# Patient Record
Sex: Female | Born: 1960 | Race: White | Hispanic: No | Marital: Married | Smoking: Never smoker
Health system: Southern US, Community
[De-identification: ages and names within clinical notes are randomized; demographics above are authoritative.]

## PROBLEM LIST (undated history)

## (undated) DIAGNOSIS — M1990 Unspecified osteoarthritis, unspecified site: Secondary | ICD-10-CM

## (undated) DIAGNOSIS — G2581 Restless legs syndrome: Secondary | ICD-10-CM

## (undated) DIAGNOSIS — Z9889 Other specified postprocedural states: Secondary | ICD-10-CM

## (undated) DIAGNOSIS — O039 Complete or unspecified spontaneous abortion without complication: Secondary | ICD-10-CM

## (undated) DIAGNOSIS — T4145XA Adverse effect of unspecified anesthetic, initial encounter: Secondary | ICD-10-CM

## (undated) DIAGNOSIS — R112 Nausea with vomiting, unspecified: Secondary | ICD-10-CM

## (undated) DIAGNOSIS — T8859XA Other complications of anesthesia, initial encounter: Secondary | ICD-10-CM

## (undated) HISTORY — PX: ADENOIDECTOMY: SUR15

## (undated) HISTORY — PX: TONSILLECTOMY: SUR1361

## (undated) HISTORY — PX: DILATION AND CURETTAGE OF UTERUS: SHX78

## (undated) HISTORY — DX: Complete or unspecified spontaneous abortion without complication: O03.9

## (undated) HISTORY — PX: OVARIAN CYST SURGERY: SHX726

## (undated) HISTORY — PX: KNEE SURGERY: SHX244

---

## 2002-03-12 ENCOUNTER — Ambulatory Visit (HOSPITAL_COMMUNITY): Admission: AD | Admit: 2002-03-12 | Discharge: 2002-03-12 | Payer: Self-pay | Admitting: Obstetrics and Gynecology

## 2002-05-20 ENCOUNTER — Inpatient Hospital Stay (HOSPITAL_COMMUNITY): Admission: AD | Admit: 2002-05-20 | Discharge: 2002-05-20 | Payer: Self-pay | Admitting: Obstetrics & Gynecology

## 2002-05-25 ENCOUNTER — Inpatient Hospital Stay (HOSPITAL_COMMUNITY): Admission: AD | Admit: 2002-05-25 | Discharge: 2002-05-25 | Payer: Self-pay | Admitting: Obstetrics and Gynecology

## 2002-05-30 ENCOUNTER — Inpatient Hospital Stay (HOSPITAL_COMMUNITY): Admission: AD | Admit: 2002-05-30 | Discharge: 2002-06-01 | Payer: Self-pay | Admitting: Obstetrics and Gynecology

## 2003-07-28 ENCOUNTER — Other Ambulatory Visit: Admission: RE | Admit: 2003-07-28 | Discharge: 2003-07-28 | Payer: Self-pay | Admitting: Obstetrics and Gynecology

## 2004-09-07 ENCOUNTER — Other Ambulatory Visit: Admission: RE | Admit: 2004-09-07 | Discharge: 2004-09-07 | Payer: Self-pay | Admitting: Obstetrics and Gynecology

## 2005-10-07 ENCOUNTER — Other Ambulatory Visit: Admission: RE | Admit: 2005-10-07 | Discharge: 2005-10-07 | Payer: Self-pay | Admitting: Obstetrics and Gynecology

## 2011-01-20 ENCOUNTER — Ambulatory Visit
Admission: RE | Admit: 2011-01-20 | Discharge: 2011-01-20 | Disposition: A | Discharge status: Home / Self Care | Payer: BC Managed Care – PPO | Source: Ambulatory Visit | Attending: Obstetrics and Gynecology | Admitting: Obstetrics and Gynecology

## 2011-01-20 ENCOUNTER — Other Ambulatory Visit: Payer: Self-pay | Admitting: Obstetrics and Gynecology

## 2011-01-20 DIAGNOSIS — R928 Other abnormal and inconclusive findings on diagnostic imaging of breast: Secondary | ICD-10-CM

## 2011-01-26 ENCOUNTER — Other Ambulatory Visit: Payer: Self-pay

## 2014-06-19 ENCOUNTER — Other Ambulatory Visit: Payer: Self-pay | Admitting: Family Medicine

## 2014-06-19 ENCOUNTER — Other Ambulatory Visit (HOSPITAL_COMMUNITY)
Admission: RE | Admit: 2014-06-19 | Discharge: 2014-06-19 | Disposition: A | Discharge status: Home / Self Care | Payer: 59 | Source: Ambulatory Visit | Attending: Family Medicine | Admitting: Family Medicine

## 2014-06-19 DIAGNOSIS — Z01419 Encounter for gynecological examination (general) (routine) without abnormal findings: Secondary | ICD-10-CM | POA: Insufficient documentation

## 2014-06-20 LAB — CYTOLOGY - PAP

## 2014-06-30 ENCOUNTER — Other Ambulatory Visit: Payer: Self-pay

## 2014-06-30 DIAGNOSIS — Z1231 Encounter for screening mammogram for malignant neoplasm of breast: Secondary | ICD-10-CM

## 2014-07-07 ENCOUNTER — Ambulatory Visit
Admission: RE | Admit: 2014-07-07 | Discharge: 2014-07-07 | Disposition: A | Discharge status: Home / Self Care | Payer: 59 | Source: Ambulatory Visit

## 2014-07-07 DIAGNOSIS — Z1231 Encounter for screening mammogram for malignant neoplasm of breast: Secondary | ICD-10-CM

## 2015-03-05 ENCOUNTER — Other Ambulatory Visit: Payer: Self-pay | Admitting: Family Medicine

## 2015-03-05 ENCOUNTER — Ambulatory Visit
Admission: RE | Admit: 2015-03-05 | Discharge: 2015-03-05 | Disposition: A | Discharge status: Home / Self Care | Payer: BLUE CROSS/BLUE SHIELD | Source: Ambulatory Visit | Attending: Family Medicine | Admitting: Family Medicine

## 2015-03-05 DIAGNOSIS — M25449 Effusion, unspecified hand: Secondary | ICD-10-CM

## 2015-07-14 ENCOUNTER — Other Ambulatory Visit: Payer: Self-pay

## 2015-07-14 DIAGNOSIS — Z1231 Encounter for screening mammogram for malignant neoplasm of breast: Secondary | ICD-10-CM

## 2015-07-14 DIAGNOSIS — Z803 Family history of malignant neoplasm of breast: Secondary | ICD-10-CM

## 2015-08-10 ENCOUNTER — Ambulatory Visit
Admission: RE | Admit: 2015-08-10 | Discharge: 2015-08-10 | Disposition: A | Discharge status: Home / Self Care | Payer: BLUE CROSS/BLUE SHIELD | Source: Ambulatory Visit

## 2015-08-10 DIAGNOSIS — Z803 Family history of malignant neoplasm of breast: Secondary | ICD-10-CM

## 2015-08-10 DIAGNOSIS — Z1231 Encounter for screening mammogram for malignant neoplasm of breast: Secondary | ICD-10-CM

## 2016-07-25 ENCOUNTER — Other Ambulatory Visit: Payer: Self-pay | Admitting: Family Medicine

## 2016-07-25 DIAGNOSIS — Z1231 Encounter for screening mammogram for malignant neoplasm of breast: Secondary | ICD-10-CM

## 2016-08-12 ENCOUNTER — Ambulatory Visit
Admission: RE | Admit: 2016-08-12 | Discharge: 2016-08-12 | Disposition: A | Discharge status: Home / Self Care | Payer: BLUE CROSS/BLUE SHIELD | Source: Ambulatory Visit | Attending: Family Medicine | Admitting: Family Medicine

## 2016-08-12 DIAGNOSIS — Z1231 Encounter for screening mammogram for malignant neoplasm of breast: Secondary | ICD-10-CM

## 2016-08-16 ENCOUNTER — Other Ambulatory Visit (HOSPITAL_COMMUNITY)
Admission: RE | Admit: 2016-08-16 | Discharge: 2016-08-16 | Disposition: A | Discharge status: Home / Self Care | Payer: BLUE CROSS/BLUE SHIELD | Source: Ambulatory Visit | Attending: Family Medicine | Admitting: Family Medicine

## 2016-08-16 ENCOUNTER — Other Ambulatory Visit: Payer: Self-pay | Admitting: Family Medicine

## 2016-08-16 DIAGNOSIS — Z01419 Encounter for gynecological examination (general) (routine) without abnormal findings: Secondary | ICD-10-CM | POA: Diagnosis present

## 2016-08-17 LAB — CYTOLOGY - PAP: Diagnosis: NEGATIVE

## 2016-10-07 ENCOUNTER — Ambulatory Visit (INDEPENDENT_AMBULATORY_CARE_PROVIDER_SITE_OTHER): Payer: BLUE CROSS/BLUE SHIELD | Admitting: Allergy & Immunology

## 2016-10-07 ENCOUNTER — Encounter: Payer: Self-pay | Admitting: Allergy & Immunology

## 2016-10-07 VITALS — BP 118/72 | HR 74 | Temp 98.2°F | Resp 14 | Ht 67.0 in | Wt 167.0 lb

## 2016-10-07 DIAGNOSIS — M25569 Pain in unspecified knee: Secondary | ICD-10-CM

## 2016-10-07 DIAGNOSIS — G8929 Other chronic pain: Secondary | ICD-10-CM | POA: Insufficient documentation

## 2016-10-07 DIAGNOSIS — T781XXD Other adverse food reactions, not elsewhere classified, subsequent encounter: Secondary | ICD-10-CM

## 2016-10-07 DIAGNOSIS — T781XXA Other adverse food reactions, not elsewhere classified, initial encounter: Secondary | ICD-10-CM | POA: Insufficient documentation

## 2016-10-07 MED ORDER — PROMETHAZINE HCL 25 MG PO TABS: 25.0000 mg | ORAL_TABLET | Freq: Four times a day (QID) | ORAL | 1 refills | Status: DC | PRN

## 2016-10-07 NOTE — Progress Notes (Signed)
NEW PATIENT  Date of Service/Encounter:  10/07/16   Assessment:   Adverse food reaction (seafood)    Plan/Recommendations:   1. Adverse food reaction - Testing today was negative, but the positive control was also negative making interpretation impossible. - We will get blood testing to look at evidence of allergies. - We will not send in an EpiPen at this time. - This might just be an intolerance, but I think it is a good idea to make sure that this is not an allergy.  2. Return if symptoms worsen or fail to improve.    Subjective:   LAKEDRA WASHINGTON is a 56 y.o. female presenting today for evaluation of  Chief Complaint  Patient presents with  . New Evaluation  . Allergy Testing    just wants to discuss shellfish allergy. had severe reaction September 2017. started with difficulty breathing, bloating, cramping, severe diarrhea, vomiting. takes a couple of benadryl and that helps.  most recent reaction included bloody stool.     KAILYNNE FERRINGTON has a history of the following: Patient Active Problem List   Diagnosis Date Noted  . Chronic knee pain 10/07/2016  . Adverse food reaction 10/07/2016    History obtained from: chart review and patient.  Melany Guernsey was referred by Cala Bradford, MD.     Sedra is a 56 y.o. female presenting for concerns for a seafood allergy. She was diagnosed when she was in her teenage years. At the time, she would go to beach with her family and have a big fish dinner. Symptoms have only been isolated to the GI system. Her symptoms started within 1 hour of ingestion. She will push through her symptoms, but if she starts to vomit she will decide to take Benadryl. She also has Phenergan on hand to take as needed. The GI symptoms will continue for the next 24-36 hours before resolving. She has no other systemic symptoms including wheezing, swelling, throat closure, or any other concerns.   She has avoided all shellfish since that time,  including mollusks. She eats fin fish without a problem, including salmon. Her last reaction occurred in November when she was away at a conference. At the conference, everyone was eating a dinner of salmon and lobster. She did make the kitchen staff aware of her allergies, and supposedly they were instructed to cut the medial without the risk of cross contamination. She had salmon on her plate and no evidence of lobster. However, despite this, she did develop a reaction that took around 36 hours to fully improve. At home, she manages this by strictly avoiding cross contamination with utensils. She is able to tolerate shellfish being cooked in the home however.  She is otherwise able to tolerate all of the other 7 major food allergens. She does have a history of seasonal allergic rhinitis symptoms which she treats with Flonase and Claritin as needed. She is not interested in doing testing for environmental allergens since her symptoms are not that severe. She has no history of hives. She has no history of wheezing.  Otherwise, there is no history of other atopic diseases, including asthma, drug allergies, food allergies, environmental allergies, stinging insect allergies, or urticaria. There is no significant infectious history. Vaccinations are up to date.    Past Medical History: Patient Active Problem List   Diagnosis Date Noted  . Chronic knee pain 10/07/2016  . Adverse food reaction 10/07/2016    Medication List:  Allergies as of 10/07/2016  Reactions   Shellfish Allergy Shortness Of Breath, Diarrhea, Nausea And Vomiting   Sulfa Antibiotics Rash, Other (See Comments)   Depletes potassium      Medication List       Accurate as of 10/07/16  1:20 PM. Always use your most recent med list.          Calcium 500-100 MG-UNIT Chew Chew 1,000 tablets by mouth daily.   cholecalciferol 1000 units tablet Commonly known as:  VITAMIN D Take 1,000 Units by mouth daily.   GARLIC PO Take 1  tablet by mouth daily.   promethazine 25 MG tablet Commonly known as:  PHENERGAN Take 1 tablet (25 mg total) by mouth every 6 (six) hours as needed for nausea or vomiting.   TURMERIC PO Take 1 capsule by mouth daily.       Birth History: non-contributory.   Developmental History: non-contributory.  Past Surgical History: Past Surgical History:  Procedure Laterality Date  . ADENOIDECTOMY    . KNEE SURGERY     5x  . OVARIAN CYST SURGERY    . TONSILLECTOMY       Family History: Family History  Problem Relation Age of Onset  . Allergic rhinitis Father      Social History: Alvino Chapelllen lives at home with her husband and two children (ages 7014 and 21 years). 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 This causes her to travel around the entire country on a fairly regular basis. They live in a house that was built in 1984. There is carpeting throughout the home. They have gas heating and central cooling. There are no pets within the home and no smoke exposure. They do not use dust mite covers. Prior to working at her current job, she worked in with [employer]    Review of Systems: a 14-point review of systems is pertinent for what is mentioned in HPI.  Otherwise, all other systems were negative. Constitutional: negative other than that listed in the HPI Eyes: negative other than that listed in the HPI Ears, nose, mouth, throat, and face: negative other than that listed in the HPI Respiratory: negative other than that listed in the HPI Cardiovascular: negative other than that listed in the HPI Gastrointestinal: negative other than that listed in the HPI Genitourinary: negative other than that listed in the HPI Integument: negative other than that listed in the HPI Hematologic: negative other than that listed in the HPI Musculoskeletal: negative  other than that listed in the HPI Neurological: negative other than that listed in the HPI Allergy/Immunologic: negative other than that listed in the HPI    Objective:   Blood pressure 118/72, pulse 74, temperature 98.2 F (36.8 C), temperature source Oral, resp. rate 14, height 5\' 7"  (1.702 m), weight 167 lb (75.8 kg), SpO2 97 %. Body mass index is 26.16 kg/m.   Physical Exam:  General: Alert, interactive, in no acute distress. Cooperative with the exam. Very friendly and personable.  Eyes: No conjunctival injection present on the right, No conjunctival injection present on the left, PERRL bilaterally, No discharge on the right, No discharge on the left and No Horner-Trantas dots present Ears: Right TM pearly gray with normal light reflex, Left TM pearly gray with normal light reflex, Right TM intact without perforation and Left TM intact without perforation.  Nose/Throat: External nose within normal limits and septum  midline, turbinates edematous without discharge, post-pharynx mildly erythematous without cobblestoning in the posterior oropharynx. Tonsils 2+ without exudates Neck: Supple without thyromegaly. Adenopathy: no enlarged lymph nodes appreciated in the anterior cervical, occipital, axillary, epitrochlear, inguinal, or popliteal regions Lungs: Clear to auscultation without wheezing, rhonchi or rales. No increased work of breathing. CV: Normal S1/S2, no murmurs. Capillary refill <2 seconds.  Abdomen: Nondistended, nontender. No guarding or rebound tenderness. Bowel sounds present in all fields and hypoactive  Skin: Warm and dry, without lesions or rashes. Extremities:  No clubbing, cyanosis or edema. Neuro:   Grossly intact. No focal deficits appreciated. Responsive to questions.  Diagnostic studies:   Allergy Studies:   Selected Foods Panel: Negative to shellfish mix, fish mix, flounder, trout, shrimp, crab, tuna, oyster, lobster, scallops, and salmon. However, the  positive control was negative making the test uninterpretable.    Malachi Bonds, MD FAAAAI [facility]

## 2016-10-07 NOTE — Patient Instructions (Addendum)
1. Adverse food reaction - Testing today was negative, but the positive control was also negative making interpretation impossible. - We will get blood testing to look at evidence of allergies. - We will not send in an EpiPen at this time. - This might just be an intolerance, but I think it is a good idea to make sure that this is not an allergy.  2. Return if symptoms worsen or fail to improve.  Please inform Janice Mercer of any Emergency Department visits, hospitalizations, or changes in symptoms. Call Janice Mercer before going to the ED for breathing or allergy symptoms since we might be able to fit you in for a sick visit. Feel free to contact Janice Mercer anytime with any questions, problems, or concerns.  It was a pleasure to meet you today! Best wishes in the [place] Year!   Websites that have reliable patient information: 1. American Academy of Asthma, Allergy, and Immunology: www.aaaai.org 2. Food Allergy Research and Education (FARE): foodallergy.org 3. Mothers of Asthmatics: http://www.asthmacommunitynetwork.org 4. American College of Allergy, Asthma, and Immunology: www.acaai.org

## 2016-10-10 LAB — CP658 FISH PANEL
Allergen, Salmon, f41: <0.1 kU/L
Allergen, Trout, f204: <0.1 kU/L
Tuna IgE: <0.1 kU/L

## 2016-10-10 LAB — ALLERGY-SHELLFISH PANEL
Clams: <0.1 kU/L
Lobster: <0.1 kU/L

## 2016-11-04 ENCOUNTER — Telehealth: Payer: BLUE CROSS/BLUE SHIELD | Admitting: Nurse Practitioner

## 2016-11-04 DIAGNOSIS — J0100 Acute maxillary sinusitis, unspecified: Secondary | ICD-10-CM

## 2016-11-04 MED ORDER — AMOXICILLIN-POT CLAVULANATE 875-125 MG PO TABS: 1.0000 | ORAL_TABLET | Freq: Two times a day (BID) | ORAL | 0 refills | Status: DC

## 2016-11-04 NOTE — Progress Notes (Signed)

## 2017-07-27 ENCOUNTER — Other Ambulatory Visit: Payer: Self-pay | Admitting: Family Medicine

## 2017-07-27 DIAGNOSIS — Z1231 Encounter for screening mammogram for malignant neoplasm of breast: Secondary | ICD-10-CM

## 2017-08-18 ENCOUNTER — Ambulatory Visit: Payer: BLUE CROSS/BLUE SHIELD

## 2017-10-16 ENCOUNTER — Ambulatory Visit
Admission: RE | Admit: 2017-10-16 | Discharge: 2017-10-16 | Disposition: A | Discharge status: Home / Self Care | Payer: Self-pay | Source: Ambulatory Visit | Attending: Family Medicine | Admitting: Family Medicine

## 2017-10-16 DIAGNOSIS — Z1231 Encounter for screening mammogram for malignant neoplasm of breast: Secondary | ICD-10-CM

## 2018-07-23 ENCOUNTER — Encounter (HOSPITAL_COMMUNITY): Payer: Self-pay

## 2018-07-23 NOTE — Patient Instructions (Addendum)
Janice Mercer  07/23/2018   Your procedure is scheduled on: 08-13-18  Report to [facility] Main  Entrance   Report to admitting at     1130 AM    Call this number if you have problems the morning of surgery [phone]    Remember: Do not eat food  :After Midnight.yo may have clear liquids until 0800 am then nothing by mouth     CLEAR LIQUID DIET   Foods Allowed                                                                     Foods Excluded  Coffee and tea, regular and decaf                             liquids that you cannot  Plain Jell-O in any flavor                                             see through such as: Fruit ices (not with fruit pulp)                                     milk, soups, orange juice  Iced Popsicles                                    All solid food Carbonated beverages, regular and diet                                    Cranberry, grape and apple juices Sports drinks like Gatorade Lightly seasoned clear broth or consume(fat free) Sugar, honey syrup   _____________________________________________________________________    BRUSH YOUR TEETH MORNING OF SURGERY AND RINSE YOUR MOUTH OUT, NO CHEWING GUM CANDY OR MINTS.     Take these medicines the morning of surgery with A SIP OF WATER: NONE                                You may not have any metal on your body including hair pins and              piercings  Do not wear jewelry, make-up, lotions, powders or perfumes, deodorant             Do not wear nail polish.  Do not shave  48 hours prior to surgery.        Do not bring valuables to the hospital. [facility] IS NOT             RESPONSIBLE   FOR VALUABLES.  Contacts, dentures or bridgework may not be worn into surgery.  Leave suitcase in the car. After surgery it may be brought to  your room.                  Please read over the following fact sheets you were  given: _____________________________________________________________________           [facility] - Preparing for Surgery Before surgery, you can play an important role.  Because skin is not sterile, your skin needs to be as free of germs as possible.  You can reduce the number of germs on your skin by washing with CHG (chlorahexidine gluconate) soap before surgery.  CHG is an antiseptic cleaner which kills germs and bonds with the skin to continue killing germs even after washing. Please DO NOT use if you have an allergy to CHG or antibacterial soaps.  If your skin becomes reddened/irritated stop using the CHG and inform your nurse when you arrive at Short Stay. Do not shave (including legs and underarms) for at least 48 hours prior to the first CHG shower.  You may shave your face/neck. Please follow these instructions carefully:  1.  Shower with CHG Soap the night before surgery and the  morning of Surgery.  2.  If you choose to wash your hair, wash your hair first as usual with your  normal  shampoo.  3.  After you shampoo, rinse your hair and body thoroughly to remove the  shampoo.                           4.  Use CHG as you would any other liquid soap.  You can apply chg directly  to the skin and wash                       Gently with a scrungie or clean washcloth.  5.  Apply the CHG Soap to your body ONLY FROM THE NECK DOWN.   Do not use on face/ open                           Wound or open sores. Avoid contact with eyes, ears mouth and genitals (private parts).                       Wash face,  Genitals (private parts) with your normal soap.             6.  Wash thoroughly, paying special attention to the area where your surgery  will be performed.  7.  Thoroughly rinse your body with warm water from the neck down.  8.  DO NOT shower/wash with your normal soap after using and rinsing off  the CHG Soap.                9.  Pat yourself dry with a clean towel.            10.  Wear clean  pajamas.            11.  Place clean sheets on your bed the night of your first shower and do not  sleep with pets. Day of Surgery : Do not apply any lotions/deodorants the morning of surgery.  Please wear clean clothes to the hospital/surgery center.  FAILURE TO FOLLOW THESE INSTRUCTIONS MAY RESULT IN THE CANCELLATION OF YOUR SURGERY PATIENT SIGNATURE_________________________________  NURSE SIGNATURE__________________________________  ________________________________________________________________________  WHAT IS A BLOOD TRANSFUSION? Blood Transfusion Information  A transfusion is the replacement of blood  or some of its parts. Blood is made up of multiple cells which provide different functions.  Red blood cells carry oxygen and are used for blood loss replacement.  White blood cells fight against infection.  Platelets control bleeding.  Plasma helps clot blood.  Other blood products are available for specialized needs, such as hemophilia or other clotting disorders. BEFORE THE TRANSFUSION  Who gives blood for transfusions?   Healthy volunteers who are fully evaluated to make sure their blood is safe. This is blood bank blood. Transfusion therapy is the safest it has ever been in the practice of medicine. Before blood is taken from a donor, a complete history is taken to make sure that person has no history of diseases nor engages in risky social behavior (examples are intravenous drug use or sexual activity with multiple partners). The donor's travel history is screened to minimize risk of transmitting infections, such as malaria. The donated blood is tested for signs of infectious diseases, such as HIV and hepatitis. The blood is then tested to be sure it is compatible with you in order to minimize the chance of a transfusion reaction. If you or a relative donates blood, this is often done in anticipation of surgery and is not appropriate for emergency situations. It takes many days  to process the donated blood. RISKS AND COMPLICATIONS Although transfusion therapy is very safe and saves many lives, the main dangers of transfusion include:   Getting an infectious disease.  Developing a transfusion reaction. This is an allergic reaction to something in the blood you were given. Every precaution is taken to prevent this. The decision to have a blood transfusion has been considered carefully by your caregiver before blood is given. Blood is not given unless the benefits outweigh the risks. AFTER THE TRANSFUSION  Right after receiving a blood transfusion, you will usually feel much better and more energetic. This is especially true if your red blood cells have gotten low (anemic). The transfusion raises the level of the red blood cells which carry oxygen, and this usually causes an energy increase.  The nurse administering the transfusion will monitor you carefully for complications. HOME CARE INSTRUCTIONS  No special instructions are needed after a transfusion. You may find your energy is better. Speak with your caregiver about any limitations on activity for underlying diseases you may have. SEEK MEDICAL CARE IF:   Your condition is not improving after your transfusion.  You develop redness or irritation at the intravenous (IV) site. SEEK IMMEDIATE MEDICAL CARE IF:  Any of the following symptoms occur over the next 12 hours:  Shaking chills.  You have a temperature by mouth above 102 F (38.9 C), not controlled by medicine.  Chest, back, or muscle pain.  People around you feel you are not acting correctly or are confused.  Shortness of breath or difficulty breathing.  Dizziness and fainting.  You get a rash or develop hives.  You have a decrease in urine output.  Your urine turns a dark color or changes to pink, red, or brown. Any of the following symptoms occur over the next 10 days:  You have a temperature by mouth above 102 F (38.9 C), not controlled  by medicine.  Shortness of breath.  Weakness after normal activity.  The white part of the eye turns yellow (jaundice).  You have a decrease in the amount of urine or are urinating less often.  Your urine turns a dark color or changes to pink, red, or  brown. Document Released: 09/16/2000 Document Revised: 12/12/2011 Document Reviewed: 05/05/2008 [facility] Patient Information 2014 [place], [place].  _______________________________________________________________________

## 2018-07-30 ENCOUNTER — Encounter (HOSPITAL_COMMUNITY)
Admission: RE | Admit: 2018-07-30 | Discharge: 2018-07-30 | Disposition: A | Discharge status: Home / Self Care | Payer: Managed Care, Other (non HMO) | Source: Ambulatory Visit | Attending: Orthopedic Surgery | Admitting: Orthopedic Surgery

## 2018-07-30 ENCOUNTER — Other Ambulatory Visit: Payer: Self-pay

## 2018-07-30 ENCOUNTER — Encounter (HOSPITAL_COMMUNITY): Payer: Self-pay

## 2018-07-30 DIAGNOSIS — M1712 Unilateral primary osteoarthritis, left knee: Secondary | ICD-10-CM | POA: Diagnosis not present

## 2018-07-30 DIAGNOSIS — Z01812 Encounter for preprocedural laboratory examination: Secondary | ICD-10-CM | POA: Insufficient documentation

## 2018-07-30 HISTORY — DX: Other complications of anesthesia, initial encounter: T88.59XA

## 2018-07-30 HISTORY — DX: Other specified postprocedural states: Z98.890

## 2018-07-30 HISTORY — DX: Unspecified osteoarthritis, unspecified site: M19.90

## 2018-07-30 HISTORY — DX: Nausea with vomiting, unspecified: R11.2

## 2018-07-30 HISTORY — DX: Adverse effect of unspecified anesthetic, initial encounter: T41.45XA

## 2018-07-30 LAB — SURGICAL PCR SCREEN
MRSA, PCR: NEGATIVE
Staphylococcus aureus: NEGATIVE

## 2018-07-30 NOTE — H&P (Signed)
TOTAL KNEE ADMISSION H&P  Patient is being admitted for left total knee arthroplasty.  Subjective:  Chief Complaint:  Left knee primary OA / pain  HPI: Janice Mercer, 57 y.o. female, has a history of pain and functional disability in the left knee due to arthritis and has failed non-surgical conservative treatments for greater than 12 weeks to include NSAID's and/or analgesics, corticosteriod injections, viscosupplementation injections and activity modification.  Onset of symptoms was gradual, starting >10 years ago with gradually worsening course since that time. The patient noted prior procedures on the knee to include  arthroscopy, menisectomy and micro fracture and scar debridement on the left knee(s).  Patient currently rates pain in the left knee(s) at 7 out of 10 with activity. Patient has night pain, worsening of pain with activity and weight bearing, pain that interferes with activities of daily living, pain with passive range of motion, crepitus and joint swelling.  Patient has evidence of periarticular osteophytes and joint space narrowing by imaging studies.  There is no active infection.  Risks, benefits and expectations were discussed with the patient.  Risks including but not limited to the risk of anesthesia, blood clots, nerve damage, blood vessel damage, failure of the prosthesis, infection and up to and including death.  Patient understand the risks, benefits and expectations and wishes to proceed with surgery.   PCP: Laurann Montana, MD  D/C Plans:       Home   Post-op Meds:       No Rx given  Tranexamic Acid:      To be given - IV   Decadron:      Is to be given  FYI:      ASA  Norco  DME:   Pt already has equipment   PT:   OPPT Rx given   Patient Active Problem List   Diagnosis Date Noted  . Chronic knee pain 10/07/2016  . Adverse food reaction 10/07/2016   Past Medical History:  Diagnosis Date  . Miscarriage     Past Surgical History:  Procedure Laterality  Date  . ADENOIDECTOMY    . KNEE SURGERY     5x  . OVARIAN CYST SURGERY    . TONSILLECTOMY      No current facility-administered medications for this encounter.    Current Outpatient Medications  Medication Sig Dispense Refill Last Dose  . BLACK ELDERBERRY PO Take 5 mLs by mouth at bedtime.     . Cholecalciferol (VITAMIN D3 PO) Take 63 mcg by mouth daily.     . DUEXIS 800-26.6 MG TABS Take 1 tablet by mouth 2 (two) times daily as needed for pain.  3   . Magnesium 100 MG TABS Take 100 mg by mouth 3 (three) times a week. In the evening.     . Probiotic Product (PROBIOTIC PO) Take 1 capsule by mouth daily.      Allergies  Allergen Reactions  . Shellfish Allergy Shortness Of Breath, Diarrhea and Nausea And Vomiting  . Amoxicillin     Has patient had a PCN reaction causing immediate rash, facial/tongue/throat swelling, SOB or lightheadedness with hypotension: No Has patient had a PCN reaction causing severe rash involving mucus membranes or skin necrosis: No Has patient had a PCN reaction that required hospitalization: No Has patient had a PCN reaction occurring within the last 10 years: Unknown If all of the above answers are "NO", then may proceed with Cephalosporin use.   . Lasix [Furosemide] Other (See Comments)  Facial flushing  . Sulfa Antibiotics Rash and Other (See Comments)    Depletes potassium    Social History   Tobacco Use  . Smoking status: Never Smoker  . Smokeless tobacco: Never Used  Substance Use Topics  . Alcohol use: Yes    Alcohol/week: 7.0 standard drinks    Types: 7 Glasses of wine per week    Family History  Problem Relation Age of Onset  . Allergic rhinitis Father      Review of Systems  Constitutional: Negative.   HENT: Negative.   Eyes: Negative.   Respiratory: Negative.   Cardiovascular: Negative.   Gastrointestinal: Negative.   Genitourinary: Negative.   Musculoskeletal: Positive for joint pain.  Skin: Negative.   Neurological:  Negative.   Endo/Heme/Allergies: Negative.   Psychiatric/Behavioral: Negative.     Objective:  Physical Exam  Constitutional: She is oriented to person, place, and time. She appears well-developed.  HENT:  Head: Normocephalic.  Eyes: Pupils are equal, round, and reactive to light.  Neck: Neck supple. No JVD present. No tracheal deviation present. No thyromegaly present.  Cardiovascular: Normal rate, regular rhythm and intact distal pulses.  Respiratory: Effort normal and breath sounds normal. No respiratory distress. She has no wheezes.  GI: Soft. There is no tenderness. There is no guarding.  Musculoskeletal:       Left knee: She exhibits decreased range of motion, swelling and bony tenderness. She exhibits no ecchymosis, no deformity, no laceration and no erythema. Tenderness found.  Lymphadenopathy:    She has no cervical adenopathy.  Neurological: She is alert and oriented to person, place, and time. A sensory deficit (neuropathy of right foot) is present.  Skin: Skin is warm and dry.  Psychiatric: She has a normal mood and affect.      Labs:  Estimated body mass index is 26.16 kg/m as calculated from the following:   Height as of 10/07/16: 5\' 7"  (1.702 m).   Weight as of 10/07/16: 75.8 kg.   Imaging Review Plain radiographs demonstrate severe degenerative joint disease of the left knee(s).  The bone quality appears to be good for age and reported activity level.   Preoperative templating of the joint replacement has been completed, documented, and submitted to the Operating Room personnel in order to optimize intra-operative equipment management.    Patient's anticipated LOS is less than 2 midnights, meeting these requirements: - Younger than 15 - Lives within 1 hour of care - Has a competent adult at home to recover with post-op recover - NO history of  - Chronic pain requiring opiods  - Diabetes  - Coronary Artery Disease  - Heart failure  - Heart attack  -  Stroke  - DVT/VTE  - Cardiac arrhythmia  - Respiratory Failure/COPD  - Renal failure  - Anemia  - Advanced Liver disease     Assessment/Plan:  End stage arthritis, left knee   The patient history, physical examination, clinical judgment of the provider and imaging studies are consistent with end stage degenerative joint disease of the left knee(s) and total knee arthroplasty is deemed medically necessary. The treatment options including medical management, injection therapy arthroscopy and arthroplasty were discussed at length. The risks and benefits of total knee arthroplasty were presented and reviewed. The risks due to aseptic loosening, infection, stiffness, patella tracking problems, thromboembolic complications and other imponderables were discussed. The patient acknowledged the explanation, agreed to proceed with the plan and consent was signed. Patient is being admitted for inpatient treatment for surgery,  pain control, PT, OT, prophylactic antibiotics, VTE prophylaxis, progressive ambulation and ADL's and discharge planning. The patient is planning to be discharged home.      Anastasio Auerbach Aja Whitehair   PA-C  07/30/2018, 8:00 AM

## 2018-07-30 NOTE — Progress Notes (Signed)
Cbc/diff cmp, ua, and hgba1c from 07-19-18 on chart

## 2018-08-13 ENCOUNTER — Encounter (HOSPITAL_COMMUNITY): Payer: Self-pay | Admitting: General Practice

## 2018-08-13 ENCOUNTER — Other Ambulatory Visit: Payer: Self-pay

## 2018-08-13 ENCOUNTER — Ambulatory Visit (HOSPITAL_COMMUNITY): Payer: Managed Care, Other (non HMO) | Admitting: Certified Registered Nurse Anesthetist

## 2018-08-13 ENCOUNTER — Encounter (HOSPITAL_COMMUNITY)
Admission: RE | Disposition: A | Discharge status: Home / Self Care | Payer: Self-pay | Source: Ambulatory Visit | Attending: Orthopedic Surgery

## 2018-08-13 ENCOUNTER — Observation Stay (HOSPITAL_COMMUNITY)
Admission: RE | Admit: 2018-08-13 | Discharge: 2018-08-14 | Disposition: A | Discharge status: Home / Self Care | Payer: Managed Care, Other (non HMO) | Source: Ambulatory Visit | Attending: Orthopedic Surgery | Admitting: Orthopedic Surgery

## 2018-08-13 DIAGNOSIS — Z79899 Other long term (current) drug therapy: Secondary | ICD-10-CM | POA: Insufficient documentation

## 2018-08-13 DIAGNOSIS — Z91013 Allergy to seafood: Secondary | ICD-10-CM | POA: Insufficient documentation

## 2018-08-13 DIAGNOSIS — Z882 Allergy status to sulfonamides status: Secondary | ICD-10-CM | POA: Diagnosis not present

## 2018-08-13 DIAGNOSIS — Z880 Allergy status to penicillin: Secondary | ICD-10-CM | POA: Insufficient documentation

## 2018-08-13 DIAGNOSIS — Z888 Allergy status to other drugs, medicaments and biological substances status: Secondary | ICD-10-CM | POA: Insufficient documentation

## 2018-08-13 DIAGNOSIS — M1712 Unilateral primary osteoarthritis, left knee: Secondary | ICD-10-CM | POA: Diagnosis not present

## 2018-08-13 DIAGNOSIS — Z96652 Presence of left artificial knee joint: Secondary | ICD-10-CM

## 2018-08-13 DIAGNOSIS — Z96659 Presence of unspecified artificial knee joint: Secondary | ICD-10-CM

## 2018-08-13 HISTORY — PX: TOTAL KNEE ARTHROPLASTY: SHX125

## 2018-08-13 LAB — TYPE AND SCREEN
ABO/RH(D): A NEG
ANTIBODY SCREEN: NEGATIVE

## 2018-08-13 LAB — ABO/RH: ABO/RH(D): A NEG

## 2018-08-13 SURGERY — ARTHROPLASTY, KNEE, TOTAL
Anesthesia: Spinal | Site: Knee | Laterality: Left

## 2018-08-13 MED ORDER — LACTATED RINGERS IV SOLN
INTRAVENOUS | Status: DC
  Administered 2018-08-13 (×3): via INTRAVENOUS

## 2018-08-13 MED ORDER — HYDROCODONE-ACETAMINOPHEN 7.5-325 MG PO TABS: 1.0000 | ORAL_TABLET | ORAL | 0 refills | Status: DC | PRN

## 2018-08-13 MED ORDER — DEXAMETHASONE SODIUM PHOSPHATE 10 MG/ML IJ SOLN
10.0000 mg | Freq: Once | INTRAMUSCULAR | Status: AC
  Administered 2018-08-13: 10 mg via INTRAVENOUS

## 2018-08-13 MED ORDER — SODIUM CHLORIDE (PF) 0.9 % IJ SOLN
INTRAMUSCULAR | Status: AC
  Filled 2018-08-13: qty 50

## 2018-08-13 MED ORDER — BUPIVACAINE IN DEXTROSE 0.75-8.25 % IT SOLN
INTRATHECAL | Status: DC | PRN
  Administered 2018-08-13: 1.6 mL via INTRATHECAL

## 2018-08-13 MED ORDER — ASPIRIN 81 MG PO CHEW: 81.0000 mg | CHEWABLE_TABLET | Freq: Two times a day (BID) | ORAL | 0 refills | Status: AC

## 2018-08-13 MED ORDER — BUPIVACAINE-EPINEPHRINE (PF) 0.25% -1:200000 IJ SOLN
INTRAMUSCULAR | Status: AC
  Filled 2018-08-13: qty 30

## 2018-08-13 MED ORDER — DEXAMETHASONE SODIUM PHOSPHATE 10 MG/ML IJ SOLN
10.0000 mg | Freq: Once | INTRAMUSCULAR | Status: AC
  Administered 2018-08-14: 10 mg via INTRAVENOUS
  Filled 2018-08-13: qty 1

## 2018-08-13 MED ORDER — KETOROLAC TROMETHAMINE 30 MG/ML IJ SOLN
INTRAMUSCULAR | Status: AC
  Filled 2018-08-13: qty 1

## 2018-08-13 MED ORDER — FENTANYL CITRATE (PF) 100 MCG/2ML IJ SOLN
INTRAMUSCULAR | Status: AC
  Filled 2018-08-13: qty 2

## 2018-08-13 MED ORDER — ONDANSETRON HCL 4 MG/2ML IJ SOLN
INTRAMUSCULAR | Status: DC | PRN
  Administered 2018-08-13: 4 mg via INTRAVENOUS

## 2018-08-13 MED ORDER — FERROUS SULFATE 325 (65 FE) MG PO TABS
325.0000 mg | ORAL_TABLET | Freq: Two times a day (BID) | ORAL | Status: DC
  Administered 2018-08-14: 325 mg via ORAL
  Filled 2018-08-13: qty 1

## 2018-08-13 MED ORDER — FENTANYL CITRATE (PF) 100 MCG/2ML IJ SOLN
25.0000 ug | INTRAMUSCULAR | Status: DC | PRN
  Administered 2018-08-13 (×2): 50 ug via INTRAVENOUS

## 2018-08-13 MED ORDER — PROPOFOL 500 MG/50ML IV EMUL
INTRAVENOUS | Status: DC | PRN
  Administered 2018-08-13: 50 ug/kg/min via INTRAVENOUS

## 2018-08-13 MED ORDER — CEFAZOLIN SODIUM-DEXTROSE 2-4 GM/100ML-% IV SOLN
2.0000 g | Freq: Four times a day (QID) | INTRAVENOUS | Status: AC
  Administered 2018-08-13 – 2018-08-14 (×2): 2 g via INTRAVENOUS
  Filled 2018-08-13 (×2): qty 100

## 2018-08-13 MED ORDER — HYDROCODONE-ACETAMINOPHEN 5-325 MG PO TABS
1.0000 | ORAL_TABLET | ORAL | Status: DC | PRN
  Administered 2018-08-14 (×2): 2 via ORAL
  Filled 2018-08-13 (×2): qty 2

## 2018-08-13 MED ORDER — MIDAZOLAM HCL 2 MG/2ML IJ SOLN
1.0000 mg | INTRAMUSCULAR | Status: DC
  Administered 2018-08-13: 2 mg via INTRAVENOUS
  Filled 2018-08-13: qty 2

## 2018-08-13 MED ORDER — TRANEXAMIC ACID-NACL 1000-0.7 MG/100ML-% IV SOLN
1000.0000 mg | Freq: Once | INTRAVENOUS | Status: AC
  Administered 2018-08-13: 1000 mg via INTRAVENOUS
  Filled 2018-08-13: qty 100

## 2018-08-13 MED ORDER — ACETAMINOPHEN 325 MG PO TABS: 325.0000 mg | ORAL_TABLET | Freq: Four times a day (QID) | ORAL | Status: DC | PRN

## 2018-08-13 MED ORDER — CEFAZOLIN SODIUM-DEXTROSE 2-4 GM/100ML-% IV SOLN
2.0000 g | INTRAVENOUS | Status: AC
  Administered 2018-08-13: 2 g via INTRAVENOUS
  Filled 2018-08-13: qty 100

## 2018-08-13 MED ORDER — ONDANSETRON HCL 4 MG/2ML IJ SOLN: 4.0000 mg | Freq: Once | INTRAMUSCULAR | Status: DC | PRN

## 2018-08-13 MED ORDER — PROPOFOL 10 MG/ML IV BOLUS
INTRAVENOUS | Status: AC
  Filled 2018-08-13: qty 20

## 2018-08-13 MED ORDER — METOCLOPRAMIDE HCL 5 MG PO TABS: 5.0000 mg | ORAL_TABLET | Freq: Three times a day (TID) | ORAL | Status: DC | PRN

## 2018-08-13 MED ORDER — BISACODYL 10 MG RE SUPP: 10.0000 mg | Freq: Every day | RECTAL | Status: DC | PRN

## 2018-08-13 MED ORDER — ROPIVACAINE HCL 5 MG/ML IJ SOLN
INTRAMUSCULAR | Status: DC | PRN
  Administered 2018-08-13: 30 mL via PERINEURAL

## 2018-08-13 MED ORDER — STERILE WATER FOR IRRIGATION IR SOLN
Status: DC | PRN
  Administered 2018-08-13: 2000 mL

## 2018-08-13 MED ORDER — OXYCODONE HCL 5 MG PO TABS: 5.0000 mg | ORAL_TABLET | Freq: Once | ORAL | Status: DC | PRN

## 2018-08-13 MED ORDER — ALUM & MAG HYDROXIDE-SIMETH 200-200-20 MG/5ML PO SUSP: 15.0000 mL | ORAL | Status: DC | PRN

## 2018-08-13 MED ORDER — PHENOL 1.4 % MT LIQD: 1.0000 | OROMUCOSAL | Status: DC | PRN

## 2018-08-13 MED ORDER — MENTHOL 3 MG MT LOZG: 1.0000 | LOZENGE | OROMUCOSAL | Status: DC | PRN

## 2018-08-13 MED ORDER — KETAMINE HCL 10 MG/ML IJ SOLN
INTRAMUSCULAR | Status: DC | PRN
  Administered 2018-08-13: 25 mg via INTRAVENOUS

## 2018-08-13 MED ORDER — HYDROCODONE-ACETAMINOPHEN 7.5-325 MG PO TABS
1.0000 | ORAL_TABLET | ORAL | Status: DC | PRN
  Administered 2018-08-13 (×2): 1 via ORAL
  Filled 2018-08-13 (×2): qty 1

## 2018-08-13 MED ORDER — TRANEXAMIC ACID-NACL 1000-0.7 MG/100ML-% IV SOLN
1000.0000 mg | INTRAVENOUS | Status: AC
  Administered 2018-08-13: 1000 mg via INTRAVENOUS
  Filled 2018-08-13: qty 100

## 2018-08-13 MED ORDER — 0.9 % SODIUM CHLORIDE (POUR BTL) OPTIME
TOPICAL | Status: DC | PRN
  Administered 2018-08-13: 1000 mL

## 2018-08-13 MED ORDER — SODIUM CHLORIDE 0.9 % IV SOLN
INTRAVENOUS | Status: DC
  Administered 2018-08-13: 19:00:00 via INTRAVENOUS

## 2018-08-13 MED ORDER — KETAMINE HCL 10 MG/ML IJ SOLN
INTRAMUSCULAR | Status: AC
  Filled 2018-08-13: qty 1

## 2018-08-13 MED ORDER — DEXMEDETOMIDINE HCL 200 MCG/2ML IV SOLN
INTRAVENOUS | Status: DC | PRN
  Administered 2018-08-13: 12 ug via INTRAVENOUS
  Administered 2018-08-13 (×2): 8 ug via INTRAVENOUS

## 2018-08-13 MED ORDER — DOCUSATE SODIUM 100 MG PO CAPS
100.0000 mg | ORAL_CAPSULE | Freq: Two times a day (BID) | ORAL | Status: DC
  Administered 2018-08-13 – 2018-08-14 (×2): 100 mg via ORAL
  Filled 2018-08-13 (×2): qty 1

## 2018-08-13 MED ORDER — METOCLOPRAMIDE HCL 5 MG/ML IJ SOLN: 5.0000 mg | Freq: Three times a day (TID) | INTRAMUSCULAR | Status: DC | PRN

## 2018-08-13 MED ORDER — METHOCARBAMOL 500 MG IVPB - SIMPLE MED
500.0000 mg | Freq: Four times a day (QID) | INTRAVENOUS | Status: DC | PRN
  Filled 2018-08-13 (×2): qty 50

## 2018-08-13 MED ORDER — BUPIVACAINE-EPINEPHRINE (PF) 0.25% -1:200000 IJ SOLN
INTRAMUSCULAR | Status: DC | PRN
  Administered 2018-08-13: 30 mL via PERINEURAL

## 2018-08-13 MED ORDER — FENTANYL CITRATE (PF) 100 MCG/2ML IJ SOLN
50.0000 ug | INTRAMUSCULAR | Status: DC
  Administered 2018-08-13 (×2): 50 ug via INTRAVENOUS
  Filled 2018-08-13: qty 2

## 2018-08-13 MED ORDER — PROPOFOL 10 MG/ML IV BOLUS
INTRAVENOUS | Status: DC | PRN
  Administered 2018-08-13: 10 mg via INTRAVENOUS
  Administered 2018-08-13: 20 mg via INTRAVENOUS

## 2018-08-13 MED ORDER — DIPHENHYDRAMINE HCL 12.5 MG/5ML PO ELIX
12.5000 mg | ORAL_SOLUTION | ORAL | Status: DC | PRN
  Administered 2018-08-13: 25 mg via ORAL
  Filled 2018-08-13 (×2): qty 10

## 2018-08-13 MED ORDER — ONDANSETRON HCL 4 MG/2ML IJ SOLN
4.0000 mg | Freq: Four times a day (QID) | INTRAMUSCULAR | Status: DC | PRN
  Administered 2018-08-13: 4 mg via INTRAVENOUS
  Filled 2018-08-13: qty 2

## 2018-08-13 MED ORDER — MAGNESIUM CITRATE PO SOLN: 1.0000 | Freq: Once | ORAL | Status: DC | PRN

## 2018-08-13 MED ORDER — POLYETHYLENE GLYCOL 3350 17 G PO PACK: 17.0000 g | PACK | Freq: Two times a day (BID) | ORAL | 0 refills | Status: DC

## 2018-08-13 MED ORDER — ONDANSETRON HCL 4 MG PO TABS: 4.0000 mg | ORAL_TABLET | Freq: Four times a day (QID) | ORAL | Status: DC | PRN

## 2018-08-13 MED ORDER — KETOROLAC TROMETHAMINE 30 MG/ML IJ SOLN
INTRAMUSCULAR | Status: DC | PRN
  Administered 2018-08-13: 30 mg via INTRA_ARTICULAR

## 2018-08-13 MED ORDER — CHLORHEXIDINE GLUCONATE 4 % EX LIQD: 60.0000 mL | Freq: Once | CUTANEOUS | Status: DC

## 2018-08-13 MED ORDER — SODIUM CHLORIDE (PF) 0.9 % IJ SOLN
INTRAMUSCULAR | Status: DC | PRN
  Administered 2018-08-13: 30 mL

## 2018-08-13 MED ORDER — METHOCARBAMOL 500 MG PO TABS
500.0000 mg | ORAL_TABLET | Freq: Four times a day (QID) | ORAL | Status: DC | PRN
  Administered 2018-08-13: 500 mg via ORAL
  Filled 2018-08-13: qty 1

## 2018-08-13 MED ORDER — FERROUS SULFATE 325 (65 FE) MG PO TABS: 325.0000 mg | ORAL_TABLET | Freq: Three times a day (TID) | ORAL | 3 refills | Status: DC

## 2018-08-13 MED ORDER — POLYETHYLENE GLYCOL 3350 17 G PO PACK
17.0000 g | PACK | Freq: Two times a day (BID) | ORAL | Status: DC
  Filled 2018-08-13: qty 1

## 2018-08-13 MED ORDER — ASPIRIN 81 MG PO CHEW
81.0000 mg | CHEWABLE_TABLET | Freq: Two times a day (BID) | ORAL | Status: DC
  Administered 2018-08-13 – 2018-08-14 (×2): 81 mg via ORAL
  Filled 2018-08-13 (×2): qty 1

## 2018-08-13 MED ORDER — OXYCODONE HCL 5 MG/5ML PO SOLN
5.0000 mg | Freq: Once | ORAL | Status: DC | PRN
  Filled 2018-08-13: qty 5

## 2018-08-13 MED ORDER — HYDROMORPHONE HCL 1 MG/ML IJ SOLN
0.5000 mg | INTRAMUSCULAR | Status: DC | PRN
  Administered 2018-08-13: 1 mg via INTRAVENOUS
  Filled 2018-08-13: qty 1

## 2018-08-13 MED ORDER — DOCUSATE SODIUM 100 MG PO CAPS: 100.0000 mg | ORAL_CAPSULE | Freq: Two times a day (BID) | ORAL | 0 refills | Status: DC

## 2018-08-13 MED ORDER — METHOCARBAMOL 500 MG PO TABS: 500.0000 mg | ORAL_TABLET | Freq: Four times a day (QID) | ORAL | 0 refills | Status: DC | PRN

## 2018-08-13 SURGICAL SUPPLY — 55 items
ATTUNE MED ANAT PAT 35 KNEE (Knees) ×2 IMPLANT
ATTUNE MED ANAT PAT 35MM KNEE (Knees) ×1 IMPLANT
ATTUNE PSFEM LTSZ4 NARCEM KNEE (Femur) ×3 IMPLANT
ATTUNE PSRP INSR SZ4 6 KNEE (Insert) ×2 IMPLANT
ATTUNE PSRP INSR SZ4 6MM KNEE (Insert) ×1 IMPLANT
BAG ZIPLOCK 12X15 (MISCELLANEOUS) IMPLANT
BANDAGE ACE 6X5 VEL STRL LF (GAUZE/BANDAGES/DRESSINGS) ×3 IMPLANT
BANDAGE ELASTIC 6 VELCRO ST LF (GAUZE/BANDAGES/DRESSINGS) ×3 IMPLANT
BASE TIBIAL ROT PLAT SZ 5 KNEE (Knees) ×1 IMPLANT
BLADE SAW SGTL 11.0X1.19X90.0M (BLADE) IMPLANT
BLADE SAW SGTL 13.0X1.19X90.0M (BLADE) ×3 IMPLANT
BOWL SMART MIX CTS (DISPOSABLE) ×3 IMPLANT
CEMENT HV SMART SET (Cement) ×6 IMPLANT
COVER SURGICAL LIGHT HANDLE (MISCELLANEOUS) ×3 IMPLANT
COVER WAND RF STERILE (DRAPES) ×3 IMPLANT
CUFF TOURN SGL QUICK 34 (TOURNIQUET CUFF) ×2
CUFF TRNQT CYL 34X4X40X1 (TOURNIQUET CUFF) ×1 IMPLANT
DECANTER SPIKE VIAL GLASS SM (MISCELLANEOUS) ×6 IMPLANT
DERMABOND ADVANCED (GAUZE/BANDAGES/DRESSINGS) ×2
DERMABOND ADVANCED .7 DNX12 (GAUZE/BANDAGES/DRESSINGS) ×1 IMPLANT
DRAPE U-SHAPE 47X51 STRL (DRAPES) ×3 IMPLANT
DRESSING AQUACEL AG SP 3.5X10 (GAUZE/BANDAGES/DRESSINGS) ×1 IMPLANT
DRSG AQUACEL AG SP 3.5X10 (GAUZE/BANDAGES/DRESSINGS) ×3
DURAPREP 26ML APPLICATOR (WOUND CARE) ×6 IMPLANT
ELECT REM PT RETURN 15FT ADLT (MISCELLANEOUS) ×3 IMPLANT
GLOVE BIOGEL M 7.0 STRL (GLOVE) IMPLANT
GLOVE BIOGEL PI IND STRL 7.5 (GLOVE) ×1 IMPLANT
GLOVE BIOGEL PI IND STRL 8.5 (GLOVE) ×1 IMPLANT
GLOVE BIOGEL PI INDICATOR 7.5 (GLOVE) ×2
GLOVE BIOGEL PI INDICATOR 8.5 (GLOVE) ×2
GLOVE ECLIPSE 8.0 STRL XLNG CF (GLOVE) ×3 IMPLANT
GLOVE ORTHO TXT STRL SZ7.5 (GLOVE) ×6 IMPLANT
GOWN STRL REUS W/TWL 2XL LVL3 (GOWN DISPOSABLE) ×3 IMPLANT
GOWN STRL REUS W/TWL LRG LVL3 (GOWN DISPOSABLE) ×3 IMPLANT
HANDPIECE INTERPULSE COAX TIP (DISPOSABLE) ×2
HOLDER FOLEY CATH W/STRAP (MISCELLANEOUS) IMPLANT
MANIFOLD NEPTUNE II (INSTRUMENTS) ×3 IMPLANT
NDL SAFETY ECLIPSE 18X1.5 (NEEDLE) IMPLANT
NEEDLE HYPO 18GX1.5 SHARP (NEEDLE)
PACK TOTAL KNEE CUSTOM (KITS) ×3 IMPLANT
PIN STEINMAN FIXATION KNEE (PIN) ×3 IMPLANT
POSITIONER SURGICAL ARM (MISCELLANEOUS) ×3 IMPLANT
SET HNDPC FAN SPRY TIP SCT (DISPOSABLE) ×1 IMPLANT
SET PAD KNEE POSITIONER (MISCELLANEOUS) ×3 IMPLANT
SUT MNCRL AB 4-0 PS2 18 (SUTURE) ×3 IMPLANT
SUT STRATAFIX PDS+ 0 24IN (SUTURE) ×3 IMPLANT
SUT VIC AB 1 CT1 36 (SUTURE) ×3 IMPLANT
SUT VIC AB 2-0 CT1 27 (SUTURE) ×6
SUT VIC AB 2-0 CT1 TAPERPNT 27 (SUTURE) ×3 IMPLANT
SYRINGE 3CC LL L/F (MISCELLANEOUS) ×3 IMPLANT
TIBIAL BASE ROT PLAT SZ 5 KNEE (Knees) ×3 IMPLANT
TRAY FOLEY MTR SLVR 16FR STAT (SET/KITS/TRAYS/PACK) ×3 IMPLANT
WATER STERILE IRR 1000ML POUR (IV SOLUTION) ×3 IMPLANT
WRAP KNEE MAXI GEL POST OP (GAUZE/BANDAGES/DRESSINGS) ×3 IMPLANT
YANKAUER SUCT BULB TIP 10FT TU (MISCELLANEOUS) ×3 IMPLANT

## 2018-08-13 NOTE — Anesthesia Preprocedure Evaluation (Addendum)
Anesthesia Evaluation  Patient identified by MRN, date of birth, ID band Patient awake    Reviewed: Allergy & Precautions, NPO status , Patient's Chart, lab work & pertinent test results  History of Anesthesia Complications (+) PONVNegative for: history of anesthetic complications  Airway Mallampati: II  TM Distance: >3 FB Neck ROM: Full    Dental no notable dental hx.    Pulmonary neg pulmonary ROS,    Pulmonary exam normal        Cardiovascular hypertension, Normal cardiovascular exam     Neuro/Psych negative neurological ROS  negative psych ROS   GI/Hepatic negative GI ROS, Neg liver ROS,   Endo/Other  negative endocrine ROS  Renal/GU negative Renal ROS  negative genitourinary   Musculoskeletal  (+) Arthritis , Osteoarthritis,    Abdominal   Peds  Hematology negative hematology ROS (+)   Anesthesia Other Findings   Reproductive/Obstetrics                            Anesthesia Physical Anesthesia Plan  ASA: II  Anesthesia Plan: Spinal   Post-op Pain Management:  Regional for Post-op pain   Induction:   PONV Risk Score and Plan: 3 and Ondansetron, Midazolam, Propofol infusion and Treatment may vary due to age or medical condition  Airway Management Planned: Natural Airway, Nasal Cannula and Simple Face Mask  Additional Equipment: None  Intra-op Plan:   Post-operative Plan:   Informed Consent: I have reviewed the patients History and Physical, chart, labs and discussed the procedure including the risks, benefits and alternatives for the proposed anesthesia with the patient or authorized representative who has indicated his/her understanding and acceptance.     Plan Discussed with:   Anesthesia Plan Comments:        Anesthesia Quick Evaluation

## 2018-08-13 NOTE — Anesthesia Procedure Notes (Addendum)
Anesthesia Regional Block: Adductor canal block   Pre-Anesthetic Checklist: ,, timeout performed, Correct Patient, Correct Site, Correct Laterality, Correct Procedure, Correct Position, site marked, Risks and benefits discussed,  Surgical consent,  Pre-op evaluation,  At surgeon's request and post-op pain management  Laterality: Left  Prep: chloraprep       Needles:  Injection technique: Single-shot  Needle Type: Echogenic Stimulator Needle     Needle Length: 9cm  Needle Gauge: 21     Additional Needles:   Procedures:,,,, ultrasound used (permanent image in chart),,,,  Narrative:  Start time: 08/13/2018 2:35 PM End time: 08/13/2018 2:30 PM Injection made incrementally with aspirations every 5 mL.  Performed by: Personally  Anesthesiologist: Lucretia Kern, MD  Additional Notes: Monitors applied. Injection made in 5cc increments. No resistance to injection. Good needle visualization. Patient tolerated procedure well.

## 2018-08-13 NOTE — Progress Notes (Signed)
AssistedDr. Witman with left, ultrasound guided, adductor canal block. Side rails up, monitors on throughout procedure. See vital signs in flow sheet. Tolerated Procedure well.  

## 2018-08-13 NOTE — Op Note (Signed)
NAME:  Janice Mercer                      MEDICAL RECORD NO.:  [id number]                             FACILITY:  [facility]      PHYSICIAN:  Madlyn Frankel. Charlann Boxer, M.D.  DATE OF BIRTH:  1961-02-04      DATE OF PROCEDURE:  08/13/2018                                     OPERATIVE REPORT         PREOPERATIVE DIAGNOSIS:  Left knee osteoarthritis.      POSTOPERATIVE DIAGNOSIS:  Left knee osteoarthritis.      FINDINGS:  The patient was noted to have complete loss of cartilage and   bone-on-bone arthritis with associated osteophytes in the medial and patellofemoral compartments of   the knee with a significant synovitis and associated effusion.  The patient had failed months of conservative treatment including medications, injection therapy, activity modification.     PROCEDURE:  Left total knee replacement.      COMPONENTS USED:  DePuy Attune rotating platform posterior stabilized knee   system, a size 4N femur, 5 tibia, size 6 mm PS AOX insert, and 35 anatomic patellar   button.      SURGEON:  Madlyn Frankel. Charlann Boxer, M.D.      ASSISTANT:  Lanney Gins, PA-C.      ANESTHESIA:  Regional and Spinal.      SPECIMENS:  None.      COMPLICATION:  None.      DRAINS:  None.  EBL: <100cc      TOURNIQUET TIME:   Total Tourniquet Time Documented: Thigh (Left) - 33 minutes Total: Thigh (Left) - 33 minutes  .      The patient was stable to the recovery room.      INDICATION FOR PROCEDURE:  Janice Mercer is a 57 y.o. female patient of   mine.  The patient had been seen, evaluated, and treated for months conservatively in the   office with medication, activity modification, and injections.  The patient had   radiographic changes of bone-on-bone arthritis with endplate sclerosis and osteophytes noted.  Based on the radiographic changes and failed conservative measures, the patient   decided to proceed with definitive treatment, total knee replacement.  Risks of infection, DVT, component failure, need  for revision surgery, neurovascular injury were reviewed in the office setting.  The postop course was reviewed stressing the efforts to maximize post-operative satisfaction and function.  Consent was obtained for benefit of pain   relief.      PROCEDURE IN DETAIL:  The patient was brought to the operative theater.   Once adequate anesthesia, preoperative antibiotics, 2 gm of Ancef,1 gm of Tranexamic Acid, and 10 mg of Decadron administered, the patient was positioned supine with a left thigh tourniquet placed.  The  left lower extremity was prepped and draped in sterile fashion.  A time-   out was performed identifying the patient, planned procedure, and the appropriate extremity.      The left lower extremity was placed in the [facility] leg holder.  The leg was   exsanguinated, tourniquet elevated to 250 mmHg.  A midline incision was   made  followed by median parapatellar arthrotomy.  Following initial   exposure, attention was first directed to the patella.  Precut   measurement was noted to be 21 mm.  I resected down to 14 mm and used a   35 anatomic patellar button to restore patellar height as well as cover the cut surface.      The lug holes were drilled and a metal shim was placed to protect the   patella from retractors and saw blade during the procedure.      At this point, attention was now directed to the femur.  The femoral   canal was opened with a drill, irrigated to try to prevent fat emboli.  An   intramedullary rod was passed at 3 degrees valgus, 11 mm of bone was   resected off the distal femur.  Following this resection, the tibia was   subluxated anteriorly.  Using the extramedullary guide, 2 mm of bone was resected off   the proximal medial tibia.  We confirmed the gap would be   stable medially and laterally with a size 5 spacer block as well as confirmed that the tibial cut was perpendicular in the coronal plane, checking with an alignment rod.      Once this was done,  I sized the femur to be a size 4 in the anterior-   posterior dimension, chose a narrow component based on medial and   lateral dimension.  The size 4 rotation block was then pinned in   position anterior referenced using the C-clamp to set rotation.  The   anterior, posterior, and  chamfer cuts were made without difficulty nor   notching making certain that I was along the anterior cortex to help   with flexion gap stability.      The final box cut was made off the lateral aspect of distal femur.      At this point, the tibia was sized to be a size 5.  The size 5 tray was   then pinned in position through the medial third of the tubercle,   drilled, and keel punched.  Trial reduction was now carried with a 4 femur,  5 tibia, a size 6 mm PS insert, and the 35 anatomic patella botton.  The knee was brought to full extension with good flexion stability with the patella   tracking through the trochlea without application of pressure.  Given   all these findings the trial components removed.  Final components were   opened and cement was mixed.  The knee was irrigated with normal saline solution and pulse lavage.  The synovial lining was   then injected with 30 cc of 0.25% Marcaine with epinephrine, 1 cc of Toradol and 30 cc of NS for a total of 61 cc.     Final implants were then cemented onto cleaned and dried cut surfaces of bone with the knee brought to extension with a size 6 mm PS trial insert.      Once the cement had fully cured, excess cement was removed   throughout the knee.  I confirmed that I was satisfied with the range of   motion and stability, and the final size 6 mm PS AOX insert was chosen.  It was   placed into the knee.      The tourniquet had been let down at 33 minutes.  No significant   hemostasis was required.  The extensor mechanism was then reapproximated using #1 Vicryl and #  1 Stratafix sutures with the knee   in flexion.  The   remaining wound was closed with 2-0  Vicryl and running 4-0 Monocryl.   The knee was cleaned, dried, dressed sterilely using Dermabond and   Aquacel dressing.  The patient was then   brought to recovery room in stable condition, tolerating the procedure   well.   Please note that Physician Assistant, Lanney Gins, PA-C was present for the entirety of the case, and was utilized for pre-operative positioning, peri-operative retractor management, general facilitation of the procedure and for primary wound closure at the end of the case.              Madlyn Frankel Charlann Boxer, M.D.    08/13/2018 4:26 PM

## 2018-08-13 NOTE — Discharge Instructions (Signed)

## 2018-08-13 NOTE — Anesthesia Procedure Notes (Signed)
Procedure Name: MAC Date/Time: 08/13/2018 3:05 PM Performed by: West Pugh, CRNA Pre-anesthesia Checklist: Patient identified, Emergency Drugs available, Suction available, Patient being monitored and Timeout performed Patient Re-evaluated:Patient Re-evaluated prior to induction Oxygen Delivery Method: Nasal cannula and Simple face mask Preoxygenation: Pre-oxygenation with 100% oxygen Induction Type: IV induction Placement Confirmation: positive ETCO2 Dental Injury: Teeth and Oropharynx as per pre-operative assessment

## 2018-08-13 NOTE — Anesthesia Procedure Notes (Signed)
Spinal  Patient location during procedure: OR Start time: 08/13/2018 3:05 PM End time: 08/13/2018 3:12 PM Reason for block: at surgeon's request Staffing Anesthesiologist: Lidia Collum, MD Resident/CRNA: West Pugh, CRNA Performed: resident/CRNA  Preanesthetic Checklist Completed: patient identified, site marked, surgical consent, pre-op evaluation, timeout performed, IV checked, risks and benefits discussed and monitors and equipment checked Spinal Block Patient position: sitting Prep: ChloraPrep Patient monitoring: heart rate, continuous pulse ox and blood pressure Approach: midline Injection technique: single-shot Needle Needle type: Pencan  Needle gauge: 24 G Needle length: 9 cm Assessment Sensory level: T6 Additional Notes Expiration of kit checked and confirmed. Patient tolerated procedure well,without complications with noted clear CSF. Dr Christella Hartigan at bedside during procedure. Loss of motor and sensory on exam post injection.

## 2018-08-13 NOTE — Transfer of Care (Signed)
Immediate Anesthesia Transfer of Care Note  Patient: Janice Mercer  Procedure(s) Performed: LEFT TOTAL KNEE ARTHROPLASTY (Left Knee)  Patient Location: PACU  Anesthesia Type:Spinal and MAC combined with regional for post-op pain  Level of Consciousness: drowsy and patient cooperative  Airway & Oxygen Therapy: Patient Spontanous Breathing and Patient connected to face mask oxygen  Post-op Assessment: Report given to RN and Post -op Vital signs reviewed and stable  Post vital signs: Reviewed and stable  Last Vitals:  Vitals Value Taken Time  BP 100/60 08/13/2018  5:02 PM  Temp 36.3 C 08/13/2018  5:00 PM  Pulse 66 08/13/2018  5:05 PM  Resp 13 08/13/2018  5:05 PM  SpO2 100 % 08/13/2018  5:05 PM  Vitals shown include unvalidated device data.  Last Pain:  Vitals:   08/13/18 1442  TempSrc:   PainSc: 0-No pain         Complications: No apparent anesthesia complications

## 2018-08-13 NOTE — Interval H&P Note (Signed)
History and Physical Interval Note:  08/13/2018 1:13 PM  Janice Mercer  has presented today for surgery, with the diagnosis of Left knee osteoarthritis  The various methods of treatment have been discussed with the patient and family. After consideration of risks, benefits and other options for treatment, the patient has consented to  Procedure(s) with comments: LEFT TOTAL KNEE ARTHROPLASTY (Left) - 70 mins as a surgical intervention .  The patient's history has been reviewed, patient examined, no change in status, stable for surgery.  I have reviewed the patient's chart and labs.  Questions were answered to the patient's satisfaction.     [name]

## 2018-08-14 ENCOUNTER — Encounter (HOSPITAL_COMMUNITY): Payer: Self-pay | Admitting: Orthopedic Surgery

## 2018-08-14 DIAGNOSIS — M1712 Unilateral primary osteoarthritis, left knee: Secondary | ICD-10-CM | POA: Diagnosis not present

## 2018-08-14 LAB — CBC
HEMATOCRIT: 38.3 % (ref 36.0–46.0)
HEMOGLOBIN: 12.3 g/dL (ref 12.0–15.0)
MCH: 30.7 pg (ref 26.0–34.0)
MCHC: 32.1 g/dL (ref 30.0–36.0)
MCV: 95.5 fL (ref 80.0–100.0)
NRBC: 0 % (ref 0.0–0.2)
Platelets: 236 10*3/uL (ref 150–400)
RBC: 4.01 MIL/uL (ref 3.87–5.11)
RDW: 11.6 % (ref 11.5–15.5)
WBC: 10.7 10*3/uL — AB (ref 4.0–10.5)

## 2018-08-14 LAB — BASIC METABOLIC PANEL
ANION GAP: 6 (ref 5–15)
BUN: 10 mg/dL (ref 6–20)
CHLORIDE: 108 mmol/L (ref 98–111)
CO2: 25 mmol/L (ref 22–32)
Calcium: 8.2 mg/dL — ABNORMAL LOW (ref 8.9–10.3)
Creatinine, Ser: 0.75 mg/dL (ref 0.44–1.00)
GFR calc non Af Amer: >60 mL/min (ref >60)
GLUCOSE: 162 mg/dL — AB (ref 70–99)
Potassium: 4.2 mmol/L (ref 3.5–5.1)
Sodium: 139 mmol/L (ref 135–145)

## 2018-08-14 MED ORDER — ONDANSETRON 4 MG PO TBDP: 4.0000 mg | ORAL_TABLET | Freq: Three times a day (TID) | ORAL | 0 refills | Status: DC | PRN

## 2018-08-14 NOTE — Anesthesia Postprocedure Evaluation (Signed)
Anesthesia Post Note  Patient: Dannielle Karvonenllen M Doughten  Procedure(s) Performed: LEFT TOTAL KNEE ARTHROPLASTY (Left Knee)     Patient location during evaluation: PACU Anesthesia Type: Spinal Level of consciousness: oriented and awake and alert Pain management: pain level controlled Vital Signs Assessment: post-procedure vital signs reviewed and stable Respiratory status: spontaneous breathing, respiratory function stable and patient connected to nasal cannula oxygen Cardiovascular status: blood pressure returned to baseline and stable Postop Assessment: no headache, no backache and no apparent nausea or vomiting Anesthetic complications: no    Last Vitals:  Vitals:   08/14/18 0624 08/14/18 1018  BP: 125/69 128/77  Pulse: 76 74  Resp: 17 16  Temp: 36.6 C 36.8 C  SpO2: 100% 100%    Last Pain:  Vitals:   08/14/18 1026  TempSrc:   PainSc: 1                  Lucretia Kernarolyn E Walid Haig

## 2018-08-14 NOTE — Evaluation (Signed)
Physical Therapy Evaluation Patient Details Name: Janice Mercer MRN: [id number] DOB: 01-Sep-1961 Today's Date: 08/14/2018   History of Present Illness  s/p L  TKA  Clinical Impression  Patient evaluated by Physical Therapy with no further acute PT needs identified (d/c today). All education has been completed and the patient has no further questions.  See below for any follow-up Physical Therapy or equipment needs. PT is signing off. Thank you for this referral.  Pt doing extremely well today, sister present for session, feels ready for d/c and PT in agreement    Follow Up Recommendations Follow surgeon's recommendation for DC plan and follow-up therapies    Equipment Recommendations       Recommendations for Other Services       Precautions / Restrictions Precautions Precautions: Fall;Knee Restrictions Weight Bearing Restrictions: No Other Position/Activity Restrictions: WBAT      Mobility  Bed Mobility Overal bed mobility: Needs Assistance Bed Mobility: Supine to Sit;Sit to Supine     Supine to sit: Supervision;Modified independent (Device/Increase time) Sit to supine: Supervision;Modified independent (Device/Increase time)   General bed mobility comments: for safety  Transfers Overall transfer level: Needs assistance Equipment used: Rolling walker (2 wheeled) Transfers: Sit to/from Stand Sit to Stand: Min guard         General transfer comment: cues for hand placement  Ambulation/Gait Ambulation/Gait assistance: Min guard;Supervision Gait Distance (Feet): 160 Feet   Gait Pattern/deviations: Step-through pattern;Step-to pattern;Decreased weight shift to left     General Gait Details: cues for sequence, gait progression, RW safety  Stairs            Wheelchair Mobility    Modified Rankin (Stroke Patients Only)       Balance Overall balance assessment: No apparent balance deficits (not formally assessed)                                            Pertinent Vitals/Pain Pain Assessment: 0-10 Pain Score: 1  Pain Descriptors / Indicators: Sore Pain Intervention(s): Monitored during session;Limited activity within patient's tolerance;Premedicated before session    Home Living Family/patient expects to be discharged to:: Private residence Living Arrangements: Spouse/significant other   Type of Home: House Home Access: Level entry       Home Equipment: Environmental consultant - 2 wheels;Cane - single point;Bedside commode;Shower seat      Prior Function Level of Independence: Independent               Hand Dominance        Extremity/Trunk Assessment   Upper Extremity Assessment Upper Extremity Assessment: Overall WFL for tasks assessed    Lower Extremity Assessment Lower Extremity Assessment: LLE deficits/detail LLE Deficits / Details: knee AAROM flexion  5* to 95*; ankle WFL, knee extension and hip flexion grossly 3 to 3+/5--not fully tested d/t pain       Communication   Communication: No difficulties  Cognition Arousal/Alertness: Awake/alert Behavior During Therapy: WFL for tasks assessed/performed Overall Cognitive Status: Within Functional Limits for tasks assessed                                        General Comments      Exercises Total Joint Exercises Ankle Circles/Pumps: AROM;Both;10 reps Quad Sets: AROM;10 reps;Both Short Arc Quad: AROM;Left;10 reps Heel  Slides: AROM;AAROM;10 reps;Left Hip ABduction/ADduction: AROM;Left;10 reps Straight Leg Raises: AROM;AAROM;10 reps;Left   Assessment/Plan    PT Assessment All further PT needs can be met in the next venue of care(pt to d/c today)  PT Problem List         PT Treatment Interventions      PT Goals (Current goals can be found in the Care Plan section)  Acute Rehab PT Goals PT Goal Formulation: All assessment and education complete, DC therapy    Frequency     Barriers to discharge        Co-evaluation                AM-PAC PT "6 Clicks" Daily Activity  Outcome Measure Difficulty turning over in bed (including adjusting bedclothes, sheets and blankets)?: A Little Difficulty moving from lying on back to sitting on the side of the bed? : A Little Difficulty sitting down on and standing up from a chair with arms (e.g., wheelchair, bedside commode, etc,.)?: A Little Help needed moving to and from a bed to chair (including a wheelchair)?: A Little Help needed walking in hospital room?: A Little Help needed climbing 3-5 steps with a railing? : A Little 6 Click Score: 18    End of Session Equipment Utilized During Treatment: Gait belt Activity Tolerance: Patient tolerated treatment well Patient left: in bed;with call bell/phone within reach;with family/visitor present;with nursing/sitter in room Nurse Communication: Mobility status PT Visit Diagnosis: Unsteadiness on feet (R26.81)    Time: 0932-6712 PT Time Calculation (min) (ACUTE ONLY): 34 min   Charges:   PT Evaluation $PT Eval Low Complexity: 1 Low PT Treatments $Therapeutic Exercise: 8-22 mins        Kenyon Ana, PT  Pager: [phone] Acute Rehab Dept [facility]): [phone]   08/14/2018   [facility] 08/14/2018, 12:38 PM

## 2018-08-14 NOTE — Progress Notes (Signed)
Patient ID: Dannielle Karvonenllen M Loe, female   DOB: 13-Dec-1960, 57 y.o.   MRN: [id number] Subjective: 1 Day Post-Op Procedure(s) (LRB): LEFT TOTAL KNEE ARTHROPLASTY (Left)    Patient reports pain as mild.  Pretty good night overall.  Got some sleep with medication.  Feeling pretty good this am  Objective:   VITALS:   Vitals:   08/14/18 0219 08/14/18 0624  BP: 114/65 125/69  Pulse: 71 76  Resp: 16 17  Temp: 98 F (36.7 C) 97.8 F (36.6 C)  SpO2: 100% 100%    Neurovascular intact Incision: dressing C/D/I  LABS Recent Labs    08/14/18 0421  HGB 12.3  HCT 38.3  WBC 10.7*  PLT 236    Recent Labs    08/14/18 0421  NA 139  K 4.2  BUN 10  CREATININE 0.75  GLUCOSE 162*    No results for input(s): LABPT, INR in the last 72 hours.   Assessment/Plan: 1 Day Post-Op Procedure(s) (LRB): LEFT TOTAL KNEE ARTHROPLASTY (Left)   Advance diet Up with therapy  Home today after therapy Home with Zofran Rx for any potential narcotic induced nausea RTC in 2 weeks Goals reviewed outpt PT already set up

## 2018-08-14 NOTE — Progress Notes (Signed)
Pt was provided with d/c instructions and prescriptions. After discussing the pt's plan of care upon d/c home, the pt reported no further questions or concerns.  

## 2018-08-15 NOTE — Discharge Summary (Signed)
Physician Discharge Summary  Patient ID: IVORI STORR MRN: [id number] DOB/AGE: Feb 27, 1961 57 y.o.  Admit date: 08/13/2018 Discharge date: 08/14/2018   Procedures:  Procedure(s) (LRB): LEFT TOTAL KNEE ARTHROPLASTY (Left)  Attending Physician:  Dr. [name]   Admission Diagnoses:   Left knee primary OA / pain  Discharge Diagnoses:  Principal Problem:   S/P left TKA Active Problems:   S/P knee replacement  Past Medical History:  Diagnosis Date  . Arthritis    knee  . Complication of anesthesia    slow to wake up , sensitive to anesthesia and zofran  Cries when wakes up  . Miscarriage   . PONV (postoperative nausea and vomiting)     HPI:    Janice Mercer, 57 y.o. female, has a history of pain and functional disability in the left knee due to arthritis and has failed non-surgical conservative treatments for greater than 12 weeks to include NSAID's and/or analgesics, corticosteriod injections, viscosupplementation injections and activity modification.  Onset of symptoms was gradual, starting >10 years ago with gradually worsening course since that time. The patient noted prior procedures on the knee to include  arthroscopy, menisectomy and micro fracture and scar debridement on the left knee(s).  Patient currently rates pain in the left knee(s) at 7 out of 10 with activity. Patient has night pain, worsening of pain with activity and weight bearing, pain that interferes with activities of daily living, pain with passive range of motion, crepitus and joint swelling.  Patient has evidence of periarticular osteophytes and joint space narrowing by imaging studies.  There is no active infection.  Risks, benefits and expectations were discussed with the patient.  Risks including but not limited to the risk of anesthesia, blood clots, nerve damage, blood vessel damage, failure of the prosthesis, infection and up to and including death.  Patient understand the risks, benefits and  expectations and wishes to proceed with surgery.   PCP: Laurann Montana, MD   Discharged Condition: good  Hospital Course:  Patient underwent the above stated procedure on 08/13/2018. Patient tolerated the procedure well and brought to the recovery room in good condition and subsequently to the floor.  POD #1 BP: 125/69 ; Pulse: 76 ; Temp: 97.8 F (36.6 C) ; Resp: 17 Patient reports pain as mild.  Pretty good night overall.  Got some sleep with medication.  Feeling pretty good this am.  Ready to be discharged home. Neurovascular intact and incision: dressing C/D/I.   LABS  Basename    HGB     12.3  HCT     38.3    Discharge Exam: General appearance: alert, cooperative and no distress Extremities: Homans sign is negative, no sign of DVT, no edema, redness or tenderness in the calves or thighs and no ulcers, gangrene or trophic changes  Disposition:  Home with follow up in 2 weeks   Follow-up Information    [name], MD. Schedule an appointment as soon as possible for a visit in 2 weeks.   Specialty:  Orthopedic Surgery Contact information: [address] [place] [address] [phone]           Discharge Instructions    Call MD / Call 911   Complete by:  As directed    If you experience chest pain or shortness of breath, CALL 911 and be transported to the hospital emergency room.  If you develope a fever above 101 F, pus (white drainage) or increased drainage or redness at  the wound, or calf pain, call your surgeon's office.   Change dressing   Complete by:  As directed    Maintain surgical dressing until follow up in the clinic. If the edges start to pull up, may reinforce with tape. If the dressing is no longer working, may remove and cover with gauze and tape, but must keep the area dry and clean.  Call with any questions or concerns.   Constipation Prevention   Complete by:  As directed    Drink plenty of fluids.  Prune juice may be helpful.   You may use a stool softener, such as Colace (over the counter) 100 mg twice a day.  Use MiraLax (over the counter) for constipation as needed.   Diet - low sodium heart healthy   Complete by:  As directed    Discharge instructions   Complete by:  As directed    Maintain surgical dressing until follow up in the clinic. If the edges start to pull up, may reinforce with tape. If the dressing is no longer working, may remove and cover with gauze and tape, but must keep the area dry and clean.  Follow up in 2 weeks at [facility]. Call with any questions or concerns.   Increase activity slowly as tolerated   Complete by:  As directed    Weight bearing as tolerated with assist device (walker, cane, etc) as directed, use it as long as suggested by your surgeon or therapist, typically at least 4-6 weeks.   TED hose   Complete by:  As directed    Use stockings (TED hose) for 2 weeks on both leg(s).  You may remove them at night for sleeping.      Allergies as of 08/14/2018      Reactions   Shellfish Allergy Shortness Of Breath, Diarrhea, Nausea And Vomiting   Amoxicillin    Has patient had a PCN reaction causing immediate rash, facial/tongue/throat swelling, SOB or lightheadedness with hypotension: No Has patient had a PCN reaction causing severe rash involving mucus membranes or skin necrosis: No Has patient had a PCN reaction that required hospitalization: No Has patient had a PCN reaction occurring within the last 10 years: Unknown If all of the above answers are "NO", then may proceed with Cephalosporin use.   Lasix [furosemide] Other (See Comments)   Facial flushing   Sulfa Antibiotics Rash, Other (See Comments)   Depletes potassium      Medication List    STOP taking these medications   DUEXIS 800-26.6 MG Tabs Generic drug:  Ibuprofen-Famotidine     TAKE these medications   aspirin 81 MG chewable tablet Chew 1 tablet (81 mg total) by mouth 2 (two) times daily. Take  for 4 weeks, then resume regular dose.   BLACK ELDERBERRY PO Take 5 mLs by mouth at bedtime.   docusate sodium 100 MG capsule Commonly known as:  COLACE Take 1 capsule (100 mg total) by mouth 2 (two) times daily.   ferrous sulfate 325 (65 FE) MG tablet Take 1 tablet (325 mg total) by mouth 3 (three) times daily with meals.   HYDROcodone-acetaminophen 7.5-325 MG tablet Commonly known as:  NORCO Take 1-2 tablets by mouth every 4 (four) hours as needed for moderate pain.   HYDROcodone-acetaminophen 7.5-325 MG tablet Commonly known as:  NORCO Take 1-2 tablets by mouth every 4 (four) hours as needed for moderate pain. Start taking on:  08/19/2018   Magnesium 100 MG Tabs Take 100 mg by  mouth 3 (three) times a week. In the evening.   methocarbamol 500 MG tablet Commonly known as:  ROBAXIN Take 1 tablet (500 mg total) by mouth every 6 (six) hours as needed for muscle spasms.   ondansetron 4 MG disintegrating tablet Commonly known as:  ZOFRAN-ODT Take 1 tablet (4 mg total) by mouth every 8 (eight) hours as needed for nausea or vomiting.   polyethylene glycol packet Commonly known as:  MIRALAX / GLYCOLAX Take 17 g by mouth 2 (two) times daily.   PROBIOTIC PO Take 1 capsule by mouth daily.   VITAMIN D3 PO Take 63 mcg by mouth daily.            Discharge Care Instructions  (From admission, onward)         Start     Ordered   08/14/18 0000  Change dressing    Comments:  Maintain surgical dressing until follow up in the clinic. If the edges start to pull up, may reinforce with tape. If the dressing is no longer working, may remove and cover with gauze and tape, but must keep the area dry and clean.  Call with any questions or concerns.   08/14/18 0940           Signed: Anastasio AuerbachMatthew S. Zahid Carneiro   PA-C  08/15/2018, 2:50 PM

## 2018-11-29 ENCOUNTER — Other Ambulatory Visit: Payer: Self-pay | Admitting: Family Medicine

## 2018-11-29 DIAGNOSIS — Z1231 Encounter for screening mammogram for malignant neoplasm of breast: Secondary | ICD-10-CM

## 2018-12-03 ENCOUNTER — Ambulatory Visit
Admission: RE | Admit: 2018-12-03 | Discharge: 2018-12-03 | Disposition: A | Discharge status: Home / Self Care | Payer: Managed Care, Other (non HMO) | Source: Ambulatory Visit

## 2018-12-03 DIAGNOSIS — Z1231 Encounter for screening mammogram for malignant neoplasm of breast: Secondary | ICD-10-CM

## 2019-03-26 ENCOUNTER — Other Ambulatory Visit: Payer: Self-pay | Admitting: Family Medicine

## 2019-03-26 ENCOUNTER — Other Ambulatory Visit (HOSPITAL_COMMUNITY)
Admission: RE | Admit: 2019-03-26 | Discharge: 2019-03-26 | Disposition: A | Discharge status: Home / Self Care | Payer: Managed Care, Other (non HMO) | Source: Ambulatory Visit | Attending: Family Medicine | Admitting: Family Medicine

## 2019-03-26 DIAGNOSIS — Z124 Encounter for screening for malignant neoplasm of cervix: Secondary | ICD-10-CM | POA: Insufficient documentation

## 2019-03-27 LAB — CYTOLOGY - PAP
Diagnosis: NEGATIVE
HPV: NOT DETECTED

## 2019-12-23 ENCOUNTER — Other Ambulatory Visit: Payer: Self-pay | Admitting: Family Medicine

## 2019-12-23 DIAGNOSIS — Z1231 Encounter for screening mammogram for malignant neoplasm of breast: Secondary | ICD-10-CM

## 2019-12-30 ENCOUNTER — Ambulatory Visit
Admission: RE | Admit: 2019-12-30 | Discharge: 2019-12-30 | Disposition: A | Discharge status: Home / Self Care | Payer: PRIVATE HEALTH INSURANCE | Source: Ambulatory Visit

## 2019-12-30 ENCOUNTER — Other Ambulatory Visit: Payer: Self-pay

## 2019-12-30 DIAGNOSIS — Z1231 Encounter for screening mammogram for malignant neoplasm of breast: Secondary | ICD-10-CM

## 2020-03-30 ENCOUNTER — Other Ambulatory Visit: Payer: Self-pay | Admitting: Family Medicine

## 2020-03-30 DIAGNOSIS — E2839 Other primary ovarian failure: Secondary | ICD-10-CM

## 2020-06-25 ENCOUNTER — Other Ambulatory Visit: Payer: PRIVATE HEALTH INSURANCE

## 2020-10-07 ENCOUNTER — Other Ambulatory Visit: Payer: PRIVATE HEALTH INSURANCE

## 2020-11-16 ENCOUNTER — Other Ambulatory Visit: Payer: Self-pay | Admitting: Family Medicine

## 2020-11-16 DIAGNOSIS — E2839 Other primary ovarian failure: Secondary | ICD-10-CM

## 2020-11-23 ENCOUNTER — Other Ambulatory Visit: Payer: PRIVATE HEALTH INSURANCE

## 2020-12-01 ENCOUNTER — Other Ambulatory Visit: Payer: Self-pay | Admitting: Family Medicine

## 2020-12-01 DIAGNOSIS — Z1231 Encounter for screening mammogram for malignant neoplasm of breast: Secondary | ICD-10-CM

## 2021-01-12 ENCOUNTER — Ambulatory Visit
Admission: RE | Admit: 2021-01-12 | Discharge: 2021-01-12 | Disposition: A | Discharge status: Home / Self Care | Payer: Managed Care, Other (non HMO) | Source: Ambulatory Visit

## 2021-01-12 ENCOUNTER — Other Ambulatory Visit: Payer: Self-pay

## 2021-01-12 DIAGNOSIS — Z1231 Encounter for screening mammogram for malignant neoplasm of breast: Secondary | ICD-10-CM

## 2021-04-16 ENCOUNTER — Other Ambulatory Visit: Payer: PRIVATE HEALTH INSURANCE

## 2021-04-29 ENCOUNTER — Other Ambulatory Visit: Payer: Managed Care, Other (non HMO)

## 2021-05-27 ENCOUNTER — Other Ambulatory Visit: Payer: Self-pay

## 2021-05-27 ENCOUNTER — Ambulatory Visit
Admission: RE | Admit: 2021-05-27 | Discharge: 2021-05-27 | Disposition: A | Discharge status: Home / Self Care | Payer: Managed Care, Other (non HMO) | Source: Ambulatory Visit | Attending: Family Medicine | Admitting: Family Medicine

## 2021-05-27 DIAGNOSIS — E2839 Other primary ovarian failure: Secondary | ICD-10-CM

## 2021-10-29 ENCOUNTER — Emergency Department (HOSPITAL_BASED_OUTPATIENT_CLINIC_OR_DEPARTMENT_OTHER)
Admission: EM | Admit: 2021-10-29 | Discharge: 2021-10-29 | Disposition: A | Discharge status: Home / Self Care | Payer: Managed Care, Other (non HMO) | Attending: Emergency Medicine | Admitting: Emergency Medicine

## 2021-10-29 ENCOUNTER — Encounter (HOSPITAL_BASED_OUTPATIENT_CLINIC_OR_DEPARTMENT_OTHER): Payer: Self-pay | Admitting: *Deleted

## 2021-10-29 ENCOUNTER — Emergency Department (HOSPITAL_BASED_OUTPATIENT_CLINIC_OR_DEPARTMENT_OTHER): Payer: Managed Care, Other (non HMO)

## 2021-10-29 ENCOUNTER — Other Ambulatory Visit: Payer: Self-pay

## 2021-10-29 DIAGNOSIS — I447 Left bundle-branch block, unspecified: Secondary | ICD-10-CM | POA: Insufficient documentation

## 2021-10-29 DIAGNOSIS — R1084 Generalized abdominal pain: Secondary | ICD-10-CM

## 2021-10-29 DIAGNOSIS — R1013 Epigastric pain: Secondary | ICD-10-CM | POA: Diagnosis present

## 2021-10-29 HISTORY — DX: Restless legs syndrome: G25.81

## 2021-10-29 LAB — CBC
HCT: 43.8 % (ref 36.0–46.0)
Hemoglobin: 14.8 g/dL (ref 12.0–15.0)
MCH: 31.1 pg (ref 26.0–34.0)
MCHC: 33.8 g/dL (ref 30.0–36.0)
MCV: 92 fL (ref 80.0–100.0)
Platelets: 271 10*3/uL (ref 150–400)
RBC: 4.76 MIL/uL (ref 3.87–5.11)
RDW: 12.1 % (ref 11.5–15.5)
WBC: 6.8 10*3/uL (ref 4.0–10.5)
nRBC: 0 % (ref 0.0–0.2)

## 2021-10-29 LAB — BASIC METABOLIC PANEL
Anion gap: 9 (ref 5–15)
BUN: 11 mg/dL (ref 8–23)
CO2: 26 mmol/L (ref 22–32)
Calcium: 9.6 mg/dL (ref 8.9–10.3)
Chloride: 107 mmol/L (ref 98–111)
Creatinine, Ser: 0.87 mg/dL (ref 0.44–1.00)
GFR, Estimated: >60 mL/min (ref >60)
Glucose, Bld: 166 mg/dL — ABNORMAL HIGH (ref 70–99)
Potassium: 4 mmol/L (ref 3.5–5.1)
Sodium: 142 mmol/L (ref 135–145)

## 2021-10-29 LAB — TROPONIN I (HIGH SENSITIVITY)
Troponin I (High Sensitivity): 4 ng/L (ref <18)
Troponin I (High Sensitivity): 5 ng/L (ref <18)

## 2021-10-29 NOTE — ED Notes (Signed)
Patient transported to X-ray 

## 2021-10-29 NOTE — ED Triage Notes (Signed)
C/o epigastric pain , nausea , diaphoretic, x 5 hrs ago, seen by UC later and sent  for  eval

## 2021-10-29 NOTE — ED Provider Notes (Signed)
[facility] EMERGENCY DEPARTMENT Provider Note   CSN: [id number] Arrival date & time: 10/29/21  1853     History  Chief Complaint  Patient presents with   Abdominal Pain    Janice Mercer is a 61 y.o. female.  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  Patient reports the pain did not radiate up to her chest, without associated palpitations, lightheadedness.  She does endorse nausea but denies vomiting.  Denies prior episodes similar to this.  Denies previous cardiac history.  Without significant family cardiac history.  Patient reports when she returned home from the salon pain got severe she became nauseous and she took a Tums with complete resolution of her pain.  Patient is currently pain-free and without complaints.   The history is provided by the patient. No language interpreter was used.      Home Medications Prior to Admission medications   Medication Sig Start Date End Date Taking? Authorizing Provider  BLACK ELDERBERRY PO Take 5 mLs by mouth at bedtime.    [provider]  Cholecalciferol (VITAMIN D3 PO) Take 63 mcg by mouth daily.    [provider]  docusate sodium (COLACE) 100 MG capsule Take 1 capsule (100 mg total) by mouth 2 (two) times daily. 08/13/18   Danae Orleans, PA-C  ferrous sulfate (FERROUSUL) 325 (65 FE) MG tablet Take 1 tablet (325 mg total) by mouth 3 (three) times daily with meals. 08/13/18   Danae Orleans, PA-C  HYDROcodone-acetaminophen (NORCO) 7.5-325 MG tablet Take 1-2 tablets by mouth every 4 (four) hours as needed for moderate pain. 08/13/18   Danae Orleans, PA-C  HYDROcodone-acetaminophen (NORCO) 7.5-325 MG tablet Take 1-2 tablets by mouth every 4 (four) hours as needed for moderate pain. 08/19/18   Danae Orleans, PA-C  Magnesium  100 MG TABS Take 100 mg by mouth 3 (three) times a week. In the evening.    [provider]  methocarbamol (ROBAXIN) 500 MG tablet Take 1 tablet (500 mg total) by mouth every 6 (six) hours as needed for muscle spasms. 08/13/18   Danae Orleans, PA-C  ondansetron (ZOFRAN ODT) 4 MG disintegrating tablet Take 1 tablet (4 mg total) by mouth every 8 (eight) hours as needed for nausea or vomiting. 08/14/18   Danae Orleans, PA-C  polyethylene glycol (MIRALAX / GLYCOLAX) packet Take 17 g by mouth 2 (two) times daily. 08/13/18   Danae Orleans, PA-C  Probiotic Product (PROBIOTIC PO) Take 1 capsule by mouth daily.    [provider]      Allergies    Shellfish allergy, Amoxicillin, Lasix [furosemide], and Sulfa antibiotics    Review of Systems   Review of Systems  Constitutional:  Negative for activity change, chills and fever.  Cardiovascular:  Negative for chest pain and palpitations.  Gastrointestinal:  Positive for abdominal pain (resolved) and nausea. Negative for vomiting.  Genitourinary:  Negative for dysuria.  Neurological:  Negative for weakness, light-headedness and headaches.  All other systems reviewed and are negative.  Physical Exam Updated Vital Signs BP (!) 150/74    Pulse 78    Temp 98 F (36.7 C) (Oral)    Resp (!) 24    Ht 5\' 8"  (1.727 m)    Wt 72.6 kg    SpO2 100%    BMI 24.33 kg/m  Physical Exam Vitals and  nursing note reviewed.  Constitutional:      General: She is not in acute distress.    Appearance: Normal appearance. She is not ill-appearing.  HENT:     Head: Normocephalic and atraumatic.     Nose: Nose normal.  Eyes:     General: No scleral icterus.    Extraocular Movements: Extraocular movements intact.     Conjunctiva/sclera: Conjunctivae normal.  Cardiovascular:     Rate and Rhythm: Normal rate and regular rhythm.     Pulses: Normal pulses.     Heart sounds: Normal heart sounds.  Pulmonary:     Effort: Pulmonary effort is normal. No  respiratory distress.     Breath sounds: Normal breath sounds. No wheezing or rales.  Abdominal:     General: There is no distension.     Palpations: Abdomen is soft.     Tenderness: There is no abdominal tenderness. There is no right CVA tenderness, left CVA tenderness or guarding.  Musculoskeletal:        General: Normal range of motion.     Cervical back: Normal range of motion.     Right lower leg: No edema.     Left lower leg: No edema.  Skin:    General: Skin is warm and dry.  Neurological:     General: No focal deficit present.     Mental Status: She is alert. Mental status is at baseline.    ED Results / Procedures / Treatments   Labs (all labs ordered are listed, but only abnormal results are displayed) Labs Reviewed  BASIC METABOLIC PANEL - Abnormal; Notable for the following components:      Result Value   Glucose, Bld 166 (*)    All other components within normal limits  CBC  TROPONIN I (HIGH SENSITIVITY)    EKG EKG Interpretation  Date/Time:  Friday October 29 2021 19:16:48 EST Ventricular Rate:  74 PR Interval:  164 QRS Duration: 155 QT Interval:  449 QTC Calculation: 499 R Axis:   47 Text Interpretation: Sinus rhythm Probable left atrial enlargement Left bundle branch block No old tracing to compare Confirmed by Calvert Cantor [phone]) on 10/29/2021 7:47:12 PM  Radiology DG Chest 2 View  Result Date: 10/29/2021 CLINICAL DATA:  Epigastric pain EXAM: CHEST - 2 VIEW COMPARISON:  None. FINDINGS: The heart size and mediastinal contours are within normal limits. Both lungs are clear. The visualized skeletal structures are unremarkable. IMPRESSION: No active cardiopulmonary disease. Electronically Signed   By: Ronney Asters M.D.   On: 10/29/2021 19:49    Procedures Procedures    Medications Ordered in ED Medications - No data to display  ED Course/ Medical Decision Making/ A&P                           Medical Decision Making Amount and/or Complexity  of Data Reviewed Labs: ordered. Radiology: ordered.   Medical Decision Making / ED Course   This patient presents to the ED for concern of epigastric abdominal pain, this involves an extensive number of treatment options, and is a complaint that carries with it a high risk of complications and morbidity.  The differential diagnosis includes ACS, pneumonia, GERD, acute intra-abdominal process  MDM: 61 year old female without significant past medical history presents today for evaluation of epigastric pain with associated nausea that resolved following taking Tums.  Patient is currently symptom-free.  Patient initial blood work significant for CBC without leukocytosis, anemia.  BMP  with glucose 166 otherwise unremarkable.  Initial troponin of 4.  Chest x-ray without acute cardiopulmonary process. Patient's EKG does show left bundle branch block.  No prior EKG from today to compare this to.  She has not aware of previous diagnosis of LBBB.  However given patient remains asymptomatic as long as second troponin remains flat we will provide patient with outpatient follow-up with cardiology. Second troponin is 5.  Patient is appropriate for discharge with cardiology follow-up.  Discussed with patient and husband who is at bedside.  They voiced understanding and are in agreement with plan.  Additional history obtained: -Additional history obtained from husband who is at bedside -External records from outside source obtained and reviewed including: Chart review including previous notes, labs, imaging, consultation notes   Lab Tests: -I ordered, reviewed, and interpreted labs.   The pertinent results include:   Labs Reviewed  BASIC METABOLIC PANEL - Abnormal; Notable for the following components:      Result Value   Glucose, Bld 166 (*)    All other components within normal limits  CBC  TROPONIN I (HIGH SENSITIVITY)      EKG  EKG Interpretation  Date/Time:  Friday October 29 2021 19:16:48  EST Ventricular Rate:  74 PR Interval:  164 QRS Duration: 155 QT Interval:  449 QTC Calculation: 499 R Axis:   47 Text Interpretation: Sinus rhythm Probable left atrial enlargement Left bundle branch block No old tracing to compare Confirmed by Calvert Cantor [id number]) on 10/29/2021 7:47:12 PM         Imaging Studies ordered: I ordered imaging studies including chest x-ray I independently visualized and interpreted imaging. I agree with the radiologist interpretation   Medicines ordered and prescription drug management: No orders of the defined types were placed in this encounter.   -I have reviewed the patients home medicines and have made adjustments as needed  Cardiac Monitoring: The patient was maintained on a cardiac monitor.  I personally viewed and interpreted the cardiac monitored which showed an underlying rhythm of: Normal sinus rhythm  Reevaluation: After the interventions noted above, I reevaluated the patient and found that they have :stayed the same Remained without symptoms throughout her emergency room stay.  Co morbidities that complicate the patient evaluation  Past Medical History:  Diagnosis Date   Arthritis    knee   Complication of anesthesia    slow to wake up , sensitive to anesthesia and zofran  Cries when wakes up   Miscarriage    PONV (postoperative nausea and vomiting)    Restless leg syndrome       Dispostion: Patient discharge in stable condition.    Final Clinical Impression(s) / ED Diagnoses Final diagnoses:  Generalized abdominal pain  Left bundle branch block    Rx / DC Orders ED Discharge Orders     None         Evlyn Courier, PA-C 10/29/21 2230    Truddie Hidden, MD 10/29/21 2255

## 2021-10-29 NOTE — Discharge Instructions (Addendum)
Your work-up in the emergency room today was reassuring.  Your blood work including troponin which is your heart enzyme were all negative.  Your chest x-ray did not show any concern for pneumonia or other concerns.  Your EKG did show a rhythm called left bundle branch block.  Given that you have remained symptom-free in the emergency room this can be followed up outpatient.  I have sent in a referral to cardiology as well as provided to their office information above.

## 2021-11-04 ENCOUNTER — Encounter: Payer: Self-pay | Admitting: Internal Medicine

## 2021-11-04 ENCOUNTER — Ambulatory Visit (INDEPENDENT_AMBULATORY_CARE_PROVIDER_SITE_OTHER): Payer: Managed Care, Other (non HMO) | Admitting: Internal Medicine

## 2021-11-04 ENCOUNTER — Other Ambulatory Visit: Payer: Self-pay

## 2021-11-04 VITALS — BP 112/72 | HR 83 | Ht 68.0 in | Wt 161.0 lb

## 2021-11-04 DIAGNOSIS — I447 Left bundle-branch block, unspecified: Secondary | ICD-10-CM

## 2021-11-04 NOTE — Progress Notes (Signed)
Cardiology Office Note:    Date:  11/04/2021   ID:  Janice Mercer, Janice Mercer Apr 27, 1961, MRN [id number]  PCP:  Laurann Montana, MD   [facility] Providers Cardiologist:  Alverda Skeans, MD Referring MD: Marita Kansas, PA-C   Chief Complaint/Reason for Referral: Left bundle branch block.  ASSESSMENT:    LBBB (left bundle branch block)   PLAN:    In order of problems listed above:  1.  This is an incidental finding of no immediate clinical concern.  She is having no signs or symptoms of angina.  We will obtain an echocardiogram.  If this demonstrates abnormal function we may need to proceed to cardiac catheterization to evaluate further.  Follow up in 6 months.  Dispo:  No follow-ups on file.     Medication Adjustments/Labs and Tests Ordered: Current medicines are reviewed at length with the patient today.  Concerns regarding medicines are outlined above.   Tests Ordered: No orders of the defined types were placed in this encounter.   Medication Changes: No orders of the defined types were placed in this encounter.   History of Present Illness:    The patient is a 61 y.o. female with the indicated medical history here for emergency room follow-up after an EKG was done which demonstrated left bundle branch block.  The patient had been evaluated in the emergency department due to abdominal pain that resolved after she took Tums.  Cardiac biomarkers were negative.  She still does not know exactly what the etiology of her abdominal pain was.  She is being evaluated by her primary care physician for other etiologies.  In terms of other symptoms the patient is active and able to do all of her activities of daily living without any issues.  She works at a desk job however.  She denies any exertional angina, exertional dyspnea, severe palpitations, presyncope, syncope, paroxysmal nocturnal dyspnea, orthopnea, peripheral edema, signs or symptoms stroke, or severe bleeding.  Other than this  emergency room visit she has required no other hospitalizations or emergency room visits for any other reasons.       Previous Medical History: Past Medical History:  Diagnosis Date   Arthritis    knee   Complication of anesthesia    slow to wake up , sensitive to anesthesia and zofran  Cries when wakes up   Miscarriage    PONV (postoperative nausea and vomiting)    Restless leg syndrome      Current Medications: No outpatient medications have been marked as taking for the 11/04/21 encounter (Appointment) with Orbie Pyo, MD.     Allergies:    Shellfish allergy, Amoxicillin, Lasix [furosemide], and Sulfa antibiotics   Social History:   Social History   Tobacco Use   Smoking status: Never   Smokeless tobacco: Never  Vaping Use   Vaping Use: Never used  Substance Use Topics   Alcohol use: Yes    Alcohol/week: 7.0 standard drinks    Types: 7 Glasses of wine per week    Comment: 2 x a week   Drug use: No     Family Hx: Family History  Problem Relation Age of Onset   Allergic rhinitis Father    Breast cancer Mother 54     Review of Systems:   Please see the history of present illness.    All other systems reviewed and are negative.     EKGs/Labs/Other Test Reviewed:    EKG: Sinus rhythm with possible left atrial enlargement  and left bundle branch block.  Prior CV studies: None available  Imaging studies that I have independently reviewed today: Chest x-ray and EKG  Recent Labs: 10/29/2021: BUN 11; Creatinine, Ser 0.87; Hemoglobin 14.8; Platelets 271; Potassium 4.0; Sodium 142   Recent Lipid Panel No results found for: CHOL, TRIG, HDL, LDLCALC, LDLDIRECT  Risk Assessment/Calculations:          Physical Exam:    VS:  There were no vitals taken for this visit.   Wt Readings from Last 3 Encounters:  10/29/21 160 lb (72.6 kg)  08/13/18 165 lb (74.8 kg)  07/30/18 165 lb (74.8 kg)    GENERAL:  No apparent distress, AOx3 HEENT:  No carotid  bruits, +2 carotid impulses, no scleral icterus CAR: RRR no murmurs, gallops, rubs, or thrills RES:  Clear to auscultation bilaterally ABD:  Soft, nontender, nondistended, positive bowel sounds x 4 VASC:  +2 radial pulses, +2 carotid pulses, palpable pedal pulses NEURO:  CN 2-12 grossly intact; motor and sensory grossly intact PSYCH:  No active depression or anxiety EXT:  No edema, ecchymosis, or cyanosis  Signed, Orbie Pyo, MD  11/04/2021 8:09 AM    [facility] [address] [place], [place], [place]  [address] Phone: [phone]; Fax: [phone]   Note:  This document was prepared using Dragon voice recognition software and may include unintentional dictation errors.

## 2021-11-04 NOTE — Patient Instructions (Signed)
Medication Instructions:  NO CHANGES If you need a refill on your cardiac medications before your next appointment, please call your pharmacy*   Lab Work: NONE If you have labs (blood work) drawn today and your tests are completely normal, you will receive your results only by: [place] (if you have MyChart) OR A paper copy in the mail If you have any lab test that is abnormal or we need to change your treatment, we will call you to review the results.   Testing/Procedures: Your physician has requested that you have an echocardiogram. Echocardiography is a painless test that uses sound waves to create images of your heart. It provides your doctor with information about the size and shape of your heart and how well your hearts chambers and valves are working. This procedure takes approximately one hour. There are no restrictions for this procedure.    Follow-Up: At [facility], you and your health needs are our priority.  As part of our continuing mission to provide you with exceptional heart care, we have created designated Provider Care Teams.  These Care Teams include your primary Cardiologist (physician) and Advanced Practice Providers (APPs -  Physician Assistants and Nurse Practitioners) who all work together to provide you with the care you need, when you need it.  We recommend signing up for the patient portal called "MyChart".  Sign up information is provided on this After Visit Summary.  MyChart is used to connect with patients for Virtual Visits (Telemedicine).  Patients are able to view lab/test results, encounter notes, upcoming appointments, etc.  Non-urgent messages can be sent to your provider as well.   To learn more about what you can do with MyChart, go to [email or web address].    Your next appointment:   1 year(s)  The format for your next appointment:   In Person  Provider: DR [facility]  Other Instructions NONE

## 2021-11-05 ENCOUNTER — Ambulatory Visit: Payer: Managed Care, Other (non HMO) | Admitting: Internal Medicine

## 2021-11-05 ENCOUNTER — Other Ambulatory Visit: Payer: Self-pay | Admitting: Family Medicine

## 2021-11-05 DIAGNOSIS — R1013 Epigastric pain: Secondary | ICD-10-CM

## 2021-11-12 ENCOUNTER — Ambulatory Visit: Payer: Managed Care, Other (non HMO) | Admitting: Internal Medicine

## 2021-11-17 ENCOUNTER — Ambulatory Visit
Admission: RE | Admit: 2021-11-17 | Discharge: 2021-11-17 | Disposition: A | Discharge status: Home / Self Care | Payer: Managed Care, Other (non HMO) | Source: Ambulatory Visit | Attending: Family Medicine | Admitting: Family Medicine

## 2021-11-17 DIAGNOSIS — R1013 Epigastric pain: Secondary | ICD-10-CM

## 2021-11-18 ENCOUNTER — Other Ambulatory Visit: Payer: Self-pay

## 2021-11-18 ENCOUNTER — Ambulatory Visit (HOSPITAL_COMMUNITY): Payer: Managed Care, Other (non HMO) | Attending: Internal Medicine

## 2021-11-18 DIAGNOSIS — I447 Left bundle-branch block, unspecified: Secondary | ICD-10-CM | POA: Insufficient documentation

## 2021-11-18 LAB — ECHOCARDIOGRAM COMPLETE
Area-P 1/2: 4.18 cm2
S' Lateral: 2.6 cm

## 2021-12-20 ENCOUNTER — Other Ambulatory Visit: Payer: Self-pay | Admitting: Family Medicine

## 2021-12-20 DIAGNOSIS — Z1231 Encounter for screening mammogram for malignant neoplasm of breast: Secondary | ICD-10-CM

## 2022-02-01 DIAGNOSIS — Z1231 Encounter for screening mammogram for malignant neoplasm of breast: Secondary | ICD-10-CM

## 2022-05-11 ENCOUNTER — Other Ambulatory Visit: Payer: Self-pay | Admitting: Family Medicine

## 2022-05-11 DIAGNOSIS — K769 Liver disease, unspecified: Secondary | ICD-10-CM

## 2022-05-24 ENCOUNTER — Ambulatory Visit
Admission: RE | Admit: 2022-05-24 | Discharge: 2022-05-24 | Disposition: A | Discharge status: Home / Self Care | Payer: Managed Care, Other (non HMO) | Source: Ambulatory Visit | Attending: Family Medicine | Admitting: Family Medicine

## 2022-05-24 DIAGNOSIS — Z1231 Encounter for screening mammogram for malignant neoplasm of breast: Secondary | ICD-10-CM

## 2022-05-25 ENCOUNTER — Ambulatory Visit
Admission: RE | Admit: 2022-05-25 | Discharge: 2022-05-25 | Disposition: A | Discharge status: Home / Self Care | Payer: Managed Care, Other (non HMO) | Source: Ambulatory Visit | Attending: Family Medicine | Admitting: Family Medicine

## 2022-05-25 DIAGNOSIS — K769 Liver disease, unspecified: Secondary | ICD-10-CM

## 2022-10-28 ENCOUNTER — Emergency Department (HOSPITAL_BASED_OUTPATIENT_CLINIC_OR_DEPARTMENT_OTHER)
Admission: EM | Admit: 2022-10-28 | Discharge: 2022-10-28 | Disposition: A | Discharge status: Home / Self Care | Payer: Managed Care, Other (non HMO) | Attending: Emergency Medicine | Admitting: Emergency Medicine

## 2022-10-28 ENCOUNTER — Emergency Department (HOSPITAL_BASED_OUTPATIENT_CLINIC_OR_DEPARTMENT_OTHER): Payer: Managed Care, Other (non HMO)

## 2022-10-28 ENCOUNTER — Other Ambulatory Visit: Payer: Self-pay

## 2022-10-28 ENCOUNTER — Encounter (HOSPITAL_BASED_OUTPATIENT_CLINIC_OR_DEPARTMENT_OTHER): Payer: Self-pay | Admitting: Emergency Medicine

## 2022-10-28 DIAGNOSIS — R739 Hyperglycemia, unspecified: Secondary | ICD-10-CM | POA: Diagnosis not present

## 2022-10-28 DIAGNOSIS — R1011 Right upper quadrant pain: Secondary | ICD-10-CM

## 2022-10-28 DIAGNOSIS — K8050 Calculus of bile duct without cholangitis or cholecystitis without obstruction: Secondary | ICD-10-CM

## 2022-10-28 DIAGNOSIS — K8020 Calculus of gallbladder without cholecystitis without obstruction: Secondary | ICD-10-CM | POA: Insufficient documentation

## 2022-10-28 LAB — COMPREHENSIVE METABOLIC PANEL
ALT: 19 U/L (ref 0–44)
AST: 28 U/L (ref 15–41)
Albumin: 3.9 g/dL (ref 3.5–5.0)
Alkaline Phosphatase: 82 U/L (ref 38–126)
Anion gap: 9 (ref 5–15)
BUN: 10 mg/dL (ref 8–23)
CO2: 23 mmol/L (ref 22–32)
Calcium: 9.3 mg/dL (ref 8.9–10.3)
Chloride: 106 mmol/L (ref 98–111)
Creatinine, Ser: 0.83 mg/dL (ref 0.44–1.00)
GFR, Estimated: >60 mL/min (ref >60)
Glucose, Bld: 153 mg/dL — ABNORMAL HIGH (ref 70–99)
Potassium: 3.9 mmol/L (ref 3.5–5.1)
Sodium: 138 mmol/L (ref 135–145)
Total Bilirubin: 0.6 mg/dL (ref 0.3–1.2)
Total Protein: 7.7 g/dL (ref 6.5–8.1)

## 2022-10-28 LAB — URINALYSIS, ROUTINE W REFLEX MICROSCOPIC
Bilirubin Urine: NEGATIVE
Glucose, UA: NEGATIVE mg/dL
Hgb urine dipstick: NEGATIVE
Ketones, ur: NEGATIVE mg/dL
Leukocytes,Ua: NEGATIVE
Nitrite: NEGATIVE
Protein, ur: 30 mg/dL — AB
Specific Gravity, Urine: 1.015 (ref 1.005–1.030)
pH: 8.5 — ABNORMAL HIGH (ref 5.0–8.0)

## 2022-10-28 LAB — URINALYSIS, MICROSCOPIC (REFLEX)

## 2022-10-28 LAB — CBC
HCT: 44.2 % (ref 36.0–46.0)
Hemoglobin: 15.2 g/dL — ABNORMAL HIGH (ref 12.0–15.0)
MCH: 31 pg (ref 26.0–34.0)
MCHC: 34.4 g/dL (ref 30.0–36.0)
MCV: 90.2 fL (ref 80.0–100.0)
Platelets: 183 10*3/uL (ref 150–400)
RBC: 4.9 MIL/uL (ref 3.87–5.11)
RDW: 11.4 % — ABNORMAL LOW (ref 11.5–15.5)
WBC: 10 10*3/uL (ref 4.0–10.5)
nRBC: 0 % (ref 0.0–0.2)

## 2022-10-28 LAB — LIPASE, BLOOD: Lipase: 55 U/L — ABNORMAL HIGH (ref 11–51)

## 2022-10-28 MED ORDER — ONDANSETRON HCL 4 MG/2ML IJ SOLN: 4.0000 mg | Freq: Four times a day (QID) | INTRAMUSCULAR | Status: DC | PRN

## 2022-10-28 MED ORDER — ONDANSETRON HCL 4 MG/2ML IJ SOLN
4.0000 mg | Freq: Once | INTRAMUSCULAR | Status: AC | PRN
  Administered 2022-10-28: 4 mg via INTRAVENOUS
  Filled 2022-10-28: qty 2

## 2022-10-28 MED ORDER — OXYCODONE-ACETAMINOPHEN 5-325 MG PO TABS: 1.0000 | ORAL_TABLET | Freq: Three times a day (TID) | ORAL | 0 refills | Status: AC | PRN

## 2022-10-28 MED ORDER — FENTANYL CITRATE PF 50 MCG/ML IJ SOSY: 50.0000 ug | PREFILLED_SYRINGE | INTRAMUSCULAR | Status: DC | PRN

## 2022-10-28 NOTE — ED Provider Notes (Signed)
  Physical Exam  BP 135/71   Pulse 68   Temp 98.1 F (36.7 C) (Oral)   Resp 16   Ht 5\' 8"  (1.727 m)   Wt 70.3 kg   SpO2 97%   BMI 23.57 kg/m   Physical Exam  Procedures  Procedures  ED Course / MDM   Clinical Course as of 10/28/22 1036  Fri Oct 28, 2022  0626 Glucose(!): 153 Mild hyperglycemia  [DW]  9417 Signed out to dr Pragya Lofaso at shift change to f/u on US imaging [DW]    Clinical Course User Index [DW] Ripley Fraise, MD   Medical Decision Making Amount and/or Complexity of Data Reviewed Labs: ordered. Decision-making details documented in ED Course. Radiology: ordered. ECG/medicine tests: ordered.  Risk Prescription drug management.   Received patient in signout.  Biliary colic with previous pain.  Lab work reassuring.  Lipase just minimally elevated.  May have some mild fluid around the pancreas.  Can be followed as an outpatient.  Pain improved.  Has had some difficulty eating however.  Has seen general surgery in the past.  Will follow as an outpatient.  Can get further CT imaging at that time also.  Will discharge home.      Davonna Belling, MD 10/28/22 1036

## 2022-10-28 NOTE — ED Notes (Signed)
Reviewed discharge instructions, follow up and medications with pt. Pt states understanding. Ambulatory at time of discharge

## 2022-10-28 NOTE — ED Provider Notes (Signed)
[place] [facility] Provider Note   CSN: [id number] Arrival date & time: 10/28/22  0532     History  Chief Complaint  Patient presents with   Flank Pain    Janice Mercer is a 62 y.o. female.  The history is provided by the patient.  Flank Pain This is a new problem. Pertinent negatives include no chest pain.  Patient presents with flank and right upper quadrant abdominal pain.  She reports this episode started around 2 AM that woke her up.  She reports the pain lasted for several hours and improved.  She was able to go back to sleep and then woke up again with severe pain and nausea.  No vomiting.  No chest pain or shortness of breath.  It starts in her right flank and then radiates throughout her abdomen.  No recent urinary symptoms.  She had otherwise been well prior to bedtime.  However she reports a similar episode about 1 week ago that was very severe in the same location and occurred at night.  She was not evaluated for the pain at that time  Patient reports approximate 1 year ago she had a similar episode and it was determined to be likely cholelithiasis.  She was seen in March by a general surgeon who recommended cholecystectomy, but she decided against it and preferred observation   Patient reports she tries to avoid greasy foods.  Home Medications Prior to Admission medications   Medication Sig Start Date End Date Taking? Authorizing Provider  BLACK ELDERBERRY PO Take 5 mLs by mouth at bedtime.    [provider]  Cholecalciferol (VITAMIN D3 PO) Take 63 mcg by mouth daily.    [provider]  gabapentin (NEURONTIN) 100 MG capsule Take 100 mg by mouth as needed (restless leg).    [provider]  Magnesium 100 MG TABS Take 100 mg by mouth 3 (three) times a week. In the evening.    [provider]  Probiotic Product (PROBIOTIC PO) Take 1 capsule by mouth daily.    [provider]       Allergies    Shellfish allergy, Amoxicillin, Lasix [furosemide], and Sulfa antibiotics    Review of Systems   Review of Systems  Constitutional:  Negative for fever.  Cardiovascular:  Negative for chest pain.  Gastrointestinal:  Positive for nausea.  Genitourinary:  Positive for flank pain.    Physical Exam Updated Vital Signs BP (!) 165/79 (BP Location: Right Arm)   Pulse 87   Temp 98.1 F (36.7 C) (Oral)   Resp 16   Ht 1.727 m (5\' 8" )   Wt 70.3 kg   SpO2 97%   BMI 23.57 kg/m  Physical Exam CONSTITUTIONAL: Well developed/well nourished HEAD: Normocephalic/atraumatic EYES: EOMI/PERRL, no icterus ENMT: Mucous membranes moist NECK: supple no meningeal signs SPINE/BACK:entire spine nontender CV: S1/S2 noted, no murmurs/rubs/gallops noted LUNGS: Lungs are clear to auscultation bilaterally, no apparent distress ABDOMEN: soft, mild right upper quadrant tenderness, no rebound or guarding, bowel sounds noted throughout abdomen GU:no cva tenderness NEURO: Pt is awake/alert/appropriate, moves all extremitiesx4.  No facial droop.   EXTREMITIES: pulses normal/equal, full ROM SKIN: warm, color normal PSYCH: no abnormalities of mood noted, alert and oriented to situation  ED Results / Procedures / Treatments   Labs (all labs ordered are listed, but only abnormal results are displayed) Labs Reviewed  LIPASE, BLOOD - Abnormal; Notable for the following components:      Result  Value   Lipase 55 (*)    All other components within normal limits  COMPREHENSIVE METABOLIC PANEL - Abnormal; Notable for the following components:   Glucose, Bld 153 (*)    All other components within normal limits  CBC - Abnormal; Notable for the following components:   Hemoglobin 15.2 (*)    RDW 11.4 (*)    All other components within normal limits  URINALYSIS, ROUTINE W REFLEX MICROSCOPIC - Abnormal; Notable for the following components:   pH 8.5 (*)    Protein, ur 30 (*)    All other components  within normal limits  URINALYSIS, MICROSCOPIC (REFLEX) - Abnormal; Notable for the following components:   Bacteria, UA RARE (*)    All other components within normal limits    EKG EKG Interpretation  Date/Time:  Friday October 28 2022 06:32:11 EST Ventricular Rate:  72 PR Interval:  167 QRS Duration: 151 QT Interval:  429 QTC Calculation: 470 R Axis:   -8 Text Interpretation: Sinus rhythm Probable left atrial enlargement Left bundle branch block Confirmed by Ripley Fraise [phone]) on 10/28/2022 6:38:41 AM  Radiology No results found.  Procedures Procedures    Medications Ordered in ED Medications  fentaNYL (SUBLIMAZE) injection 50 mcg (has no administration in time range)  ondansetron (ZOFRAN) injection 4 mg (has no administration in time range)  ondansetron (ZOFRAN) injection 4 mg (4 mg Intravenous Given 10/28/22 0556)    ED Course/ Medical Decision Making/ A&P Clinical Course as of 10/28/22 0657  Fri Oct 28, 2022  0626 Glucose(!): 153 Mild hyperglycemia  [DW]  5176 Signed out to dr pickering at shift change to f/u on US imaging [DW]    Clinical Course User Index [DW] Ripley Fraise, MD                             Medical Decision Making Amount and/or Complexity of Data Reviewed Labs: ordered. Decision-making details documented in ED Course. Radiology: ordered. ECG/medicine tests: ordered.  Risk Prescription drug management.   This patient presents to the ED for concern of flank pain and abdominal pain, this involves an extensive number of treatment options, and is a complaint that carries with it a high risk of complications and morbidity.  The differential diagnosis includes but is not limited to cholecystitis, cholelithiasis, pancreatitis, gastritis, peptic ulcer disease, appendicitis, bowel obstruction, bowel perforation, diverticulitis, AAA, ischemic bowel pyelonephritis   Social Determinants of Health: Patient's wine use increases the complexity of  managing their presentation  Additional history obtained: Additional history obtained from spouse Records reviewed Care Everywhere/External Records  Lab Tests: I Ordered, and personally interpreted labs.  The pertinent results include:  hyperglycemia  Imaging Studies ordered: I ordered imaging studies including Ultrasound right upper quadrant    Cardiac Monitoring: The patient was maintained on a cardiac monitor.  I personally viewed and interpreted the cardiac monitor which showed an underlying rhythm of:  sinus rhythm  Medicines ordered and prescription drug management: I ordered medication including zofran  for nausea   Reevaluation of the patient after these medicines showed that the patient    improved   Reevaluation: After the interventions noted above, I reevaluated the patient and found that they have :improved  Complexity of problems addressed: Patient's presentation is most consistent with  acute presentation with potential threat to life or bodily function           Final Clinical Impression(s) / ED Diagnoses Final diagnoses:  RUQ pain    Rx / DC Orders ED Discharge Orders     None         Ripley Fraise, MD 10/28/22 [phone]

## 2022-10-28 NOTE — ED Triage Notes (Signed)
Pt is c/o pain in the right side of her back that radiates around under her rib cage  Pt states she has had problems with her gallbladder in the past  Pt states she had an episode on Friday and then again tonight  Pt has nausea without vomiting

## 2022-10-28 NOTE — Discharge Instructions (Addendum)
There was a little fluid around your pancreas which will need to be followed also.  Call general surgery for follow-up.  Return for continued symptoms.

## 2022-10-28 NOTE — ED Notes (Signed)
Patient transported to Ultrasound 

## 2022-11-03 ENCOUNTER — Other Ambulatory Visit: Payer: Self-pay

## 2022-11-03 ENCOUNTER — Ambulatory Visit (HOSPITAL_COMMUNITY): Payer: Managed Care, Other (non HMO) | Admitting: Anesthesiology

## 2022-11-03 ENCOUNTER — Ambulatory Visit: Payer: Self-pay | Admitting: Surgery

## 2022-11-03 ENCOUNTER — Encounter (HOSPITAL_COMMUNITY)
Admission: RE | Disposition: A | Discharge status: Home / Self Care | Payer: Self-pay | Source: Ambulatory Visit | Attending: General Surgery

## 2022-11-03 ENCOUNTER — Ambulatory Visit (HOSPITAL_COMMUNITY)
Admission: RE | Admit: 2022-11-03 | Discharge: 2022-11-03 | Disposition: A | Discharge status: Home / Self Care | Payer: Managed Care, Other (non HMO) | Source: Ambulatory Visit | Attending: General Surgery | Admitting: General Surgery

## 2022-11-03 ENCOUNTER — Encounter (HOSPITAL_COMMUNITY): Payer: Self-pay | Admitting: General Surgery

## 2022-11-03 ENCOUNTER — Ambulatory Visit (HOSPITAL_BASED_OUTPATIENT_CLINIC_OR_DEPARTMENT_OTHER): Payer: Managed Care, Other (non HMO) | Admitting: Anesthesiology

## 2022-11-03 DIAGNOSIS — K819 Cholecystitis, unspecified: Secondary | ICD-10-CM | POA: Diagnosis not present

## 2022-11-03 DIAGNOSIS — M1990 Unspecified osteoarthritis, unspecified site: Secondary | ICD-10-CM | POA: Insufficient documentation

## 2022-11-03 DIAGNOSIS — K8010 Calculus of gallbladder with chronic cholecystitis without obstruction: Secondary | ICD-10-CM | POA: Insufficient documentation

## 2022-11-03 DIAGNOSIS — G2581 Restless legs syndrome: Secondary | ICD-10-CM | POA: Insufficient documentation

## 2022-11-03 HISTORY — PX: CHOLECYSTECTOMY: SHX55

## 2022-11-03 SURGERY — LAPAROSCOPIC CHOLECYSTECTOMY
Anesthesia: General

## 2022-11-03 MED ORDER — LIDOCAINE HCL (PF) 2 % IJ SOLN
INTRAMUSCULAR | Status: AC
  Filled 2022-11-03: qty 5

## 2022-11-03 MED ORDER — HYDROMORPHONE HCL 1 MG/ML IJ SOLN
0.2500 mg | INTRAMUSCULAR | Status: DC | PRN
  Administered 2022-11-03 (×2): 0.25 mg via INTRAVENOUS

## 2022-11-03 MED ORDER — BUPIVACAINE HCL (PF) 0.25 % IJ SOLN
INTRAMUSCULAR | Status: DC | PRN
  Administered 2022-11-03: 30 mL

## 2022-11-03 MED ORDER — CIPROFLOXACIN IN D5W 400 MG/200ML IV SOLN
400.0000 mg | INTRAVENOUS | Status: AC
  Administered 2022-11-03: 400 mg via INTRAVENOUS
  Filled 2022-11-03: qty 200

## 2022-11-03 MED ORDER — OXYCODONE HCL 5 MG PO TABS: 5.0000 mg | ORAL_TABLET | Freq: Four times a day (QID) | ORAL | 0 refills | Status: AC | PRN

## 2022-11-03 MED ORDER — PROPOFOL 10 MG/ML IV BOLUS
INTRAVENOUS | Status: AC
  Filled 2022-11-03: qty 20

## 2022-11-03 MED ORDER — METRONIDAZOLE 500 MG/100ML IV SOLN
500.0000 mg | INTRAVENOUS | Status: AC
  Administered 2022-11-03: 500 mg via INTRAVENOUS
  Filled 2022-11-03: qty 100

## 2022-11-03 MED ORDER — ONDANSETRON HCL 4 MG/2ML IJ SOLN
INTRAMUSCULAR | Status: AC
  Filled 2022-11-03: qty 2

## 2022-11-03 MED ORDER — ROCURONIUM BROMIDE 10 MG/ML (PF) SYRINGE
PREFILLED_SYRINGE | INTRAVENOUS | Status: DC | PRN
  Administered 2022-11-03: 60 mg via INTRAVENOUS

## 2022-11-03 MED ORDER — SCOPOLAMINE 1 MG/3DAYS TD PT72: 1.0000 | MEDICATED_PATCH | TRANSDERMAL | Status: DC

## 2022-11-03 MED ORDER — LACTATED RINGERS IV SOLN: INTRAVENOUS | Status: DC

## 2022-11-03 MED ORDER — GABAPENTIN 300 MG PO CAPS
300.0000 mg | ORAL_CAPSULE | ORAL | Status: AC
  Administered 2022-11-03: 300 mg via ORAL
  Filled 2022-11-03: qty 1

## 2022-11-03 MED ORDER — IBUPROFEN 800 MG PO TABS: 800.0000 mg | ORAL_TABLET | Freq: Three times a day (TID) | ORAL | 0 refills | Status: AC | PRN

## 2022-11-03 MED ORDER — ACETAMINOPHEN 500 MG PO TABS
1000.0000 mg | ORAL_TABLET | ORAL | Status: AC
  Administered 2022-11-03: 1000 mg via ORAL
  Filled 2022-11-03: qty 2

## 2022-11-03 MED ORDER — PROPOFOL 10 MG/ML IV BOLUS
INTRAVENOUS | Status: DC | PRN
  Administered 2022-11-03: 200 mg via INTRAVENOUS

## 2022-11-03 MED ORDER — CHLORHEXIDINE GLUCONATE CLOTH 2 % EX PADS: 6.0000 | MEDICATED_PAD | Freq: Once | CUTANEOUS | Status: DC

## 2022-11-03 MED ORDER — HYDROMORPHONE HCL 1 MG/ML IJ SOLN
INTRAMUSCULAR | Status: AC
  Administered 2022-11-03: 0.5 mg via INTRAVENOUS
  Filled 2022-11-03: qty 1

## 2022-11-03 MED ORDER — KETOROLAC TROMETHAMINE 30 MG/ML IJ SOLN: 30.0000 mg | Freq: Once | INTRAMUSCULAR | Status: AC | PRN

## 2022-11-03 MED ORDER — ONDANSETRON HCL 4 MG/2ML IJ SOLN
INTRAMUSCULAR | Status: AC
  Administered 2022-11-03: 4 mg via INTRAVENOUS
  Filled 2022-11-03: qty 2

## 2022-11-03 MED ORDER — SCOPOLAMINE 1 MG/3DAYS TD PT72
MEDICATED_PATCH | TRANSDERMAL | Status: AC
  Administered 2022-11-03: 1.5 mg via TRANSDERMAL
  Filled 2022-11-03: qty 1

## 2022-11-03 MED ORDER — KETOROLAC TROMETHAMINE 30 MG/ML IJ SOLN
INTRAMUSCULAR | Status: AC
  Administered 2022-11-03: 30 mg via INTRAVENOUS
  Filled 2022-11-03: qty 1

## 2022-11-03 MED ORDER — CHLORHEXIDINE GLUCONATE 0.12 % MT SOLN
15.0000 mL | Freq: Once | OROMUCOSAL | Status: AC
  Administered 2022-11-03: 15 mL via OROMUCOSAL

## 2022-11-03 MED ORDER — LACTATED RINGERS IR SOLN
Status: DC | PRN
  Administered 2022-11-03: 1000 mL

## 2022-11-03 MED ORDER — DEXAMETHASONE SODIUM PHOSPHATE 10 MG/ML IJ SOLN
INTRAMUSCULAR | Status: DC | PRN
  Administered 2022-11-03: 10 mg via INTRAVENOUS

## 2022-11-03 MED ORDER — OXYCODONE HCL 5 MG PO TABS: 5.0000 mg | ORAL_TABLET | Freq: Once | ORAL | Status: AC | PRN

## 2022-11-03 MED ORDER — KETOROLAC TROMETHAMINE 15 MG/ML IJ SOLN: 15.0000 mg | INTRAMUSCULAR | Status: DC

## 2022-11-03 MED ORDER — MIDAZOLAM HCL 5 MG/5ML IJ SOLN
INTRAMUSCULAR | Status: DC | PRN
  Administered 2022-11-03: 2 mg via INTRAVENOUS

## 2022-11-03 MED ORDER — FENTANYL CITRATE (PF) 250 MCG/5ML IJ SOLN
INTRAMUSCULAR | Status: DC | PRN
  Administered 2022-11-03 (×3): 50 ug via INTRAVENOUS
  Administered 2022-11-03: 100 ug via INTRAVENOUS

## 2022-11-03 MED ORDER — ONDANSETRON HCL 4 MG/2ML IJ SOLN: 4.0000 mg | Freq: Once | INTRAMUSCULAR | Status: AC | PRN

## 2022-11-03 MED ORDER — FENTANYL CITRATE (PF) 250 MCG/5ML IJ SOLN
INTRAMUSCULAR | Status: AC
  Filled 2022-11-03: qty 5

## 2022-11-03 MED ORDER — SUGAMMADEX SODIUM 200 MG/2ML IV SOLN
INTRAVENOUS | Status: DC | PRN
  Administered 2022-11-03: 100 mg via INTRAVENOUS
  Administered 2022-11-03: 200 mg via INTRAVENOUS

## 2022-11-03 MED ORDER — 0.9 % SODIUM CHLORIDE (POUR BTL) OPTIME
TOPICAL | Status: DC | PRN
  Administered 2022-11-03: 1000 mL

## 2022-11-03 MED ORDER — BUPIVACAINE LIPOSOME 1.3 % IJ SUSP: 20.0000 mL | Freq: Once | INTRAMUSCULAR | Status: DC

## 2022-11-03 MED ORDER — BUPIVACAINE HCL (PF) 0.25 % IJ SOLN
INTRAMUSCULAR | Status: AC
  Filled 2022-11-03: qty 30

## 2022-11-03 MED ORDER — OXYCODONE HCL 5 MG PO TABS
ORAL_TABLET | ORAL | Status: AC
  Administered 2022-11-03: 5 mg via ORAL
  Filled 2022-11-03: qty 1

## 2022-11-03 MED ORDER — DEXAMETHASONE SODIUM PHOSPHATE 10 MG/ML IJ SOLN
INTRAMUSCULAR | Status: AC
  Filled 2022-11-03: qty 1

## 2022-11-03 MED ORDER — MIDAZOLAM HCL 2 MG/2ML IJ SOLN
INTRAMUSCULAR | Status: AC
  Filled 2022-11-03: qty 2

## 2022-11-03 MED ORDER — LIDOCAINE 2% (20 MG/ML) 5 ML SYRINGE
INTRAMUSCULAR | Status: DC | PRN
  Administered 2022-11-03: 100 mg via INTRAVENOUS

## 2022-11-03 MED ORDER — ROCURONIUM BROMIDE 10 MG/ML (PF) SYRINGE
PREFILLED_SYRINGE | INTRAVENOUS | Status: AC
  Filled 2022-11-03: qty 10

## 2022-11-03 MED ORDER — ONDANSETRON HCL 4 MG/2ML IJ SOLN
INTRAMUSCULAR | Status: DC | PRN
  Administered 2022-11-03: 4 mg via INTRAVENOUS

## 2022-11-03 MED ORDER — OXYCODONE HCL 5 MG/5ML PO SOLN: 5.0000 mg | Freq: Once | ORAL | Status: AC | PRN

## 2022-11-03 SURGICAL SUPPLY — 42 items
APPLIER CLIP 5 13 M/L LIGAMAX5 (MISCELLANEOUS)
APPLIER CLIP ROT 10 11.4 M/L (STAPLE)
BAG COUNTER SPONGE SURGICOUNT (BAG) IMPLANT
BENZOIN TINCTURE PRP APPL 2/3 (GAUZE/BANDAGES/DRESSINGS) IMPLANT
BNDG ADH 1X3 SHEER STRL LF (GAUZE/BANDAGES/DRESSINGS) IMPLANT
CABLE HIGH FREQUENCY MONO STRZ (ELECTRODE) ×1 IMPLANT
CHLORAPREP W/TINT 26 (MISCELLANEOUS) ×1 IMPLANT
CLIP APPLIE 5 13 M/L LIGAMAX5 (MISCELLANEOUS) ×1 IMPLANT
CLIP APPLIE ROT 10 11.4 M/L (STAPLE) IMPLANT
CLIP LIGATING HEM O LOK PURPLE (MISCELLANEOUS) IMPLANT
CLIP LIGATING HEMO O LOK GREEN (MISCELLANEOUS) ×1 IMPLANT
COVER MAYO STAND XLG (MISCELLANEOUS) ×1 IMPLANT
COVER SURGICAL LIGHT HANDLE (MISCELLANEOUS) ×1 IMPLANT
DERMABOND ADVANCED .7 DNX12 (GAUZE/BANDAGES/DRESSINGS) IMPLANT
DRAIN CHANNEL 19F RND (DRAIN) IMPLANT
DRAPE C-ARM 42X120 X-RAY (DRAPES) IMPLANT
EVACUATOR SILICONE 100CC (DRAIN) IMPLANT
GLOVE BIOGEL PI IND STRL 7.0 (GLOVE) ×1 IMPLANT
GLOVE SURG SS PI 7.0 STRL IVOR (GLOVE) ×1 IMPLANT
GOWN STRL REUS W/ TWL LRG LVL3 (GOWN DISPOSABLE) ×1 IMPLANT
GOWN STRL REUS W/TWL LRG LVL3 (GOWN DISPOSABLE) ×1
GRASPER SUT TROCAR 14GX15 (MISCELLANEOUS) ×1 IMPLANT
IRRIG SUCT STRYKERFLOW 2 WTIP (MISCELLANEOUS) ×1
IRRIGATION SUCT STRKRFLW 2 WTP (MISCELLANEOUS) ×1 IMPLANT
KIT BASIN OR (CUSTOM PROCEDURE TRAY) ×1 IMPLANT
KIT TURNOVER KIT A (KITS) IMPLANT
POUCH RETRIEVAL ECOSAC 10 (ENDOMECHANICALS) ×1 IMPLANT
POUCH RETRIEVAL ECOSAC 10MM (ENDOMECHANICALS) ×1
SCISSORS LAP 5X35 DISP (ENDOMECHANICALS) ×1 IMPLANT
SET CHOLANGIOGRAPH MIX (MISCELLANEOUS) IMPLANT
SET TUBE SMOKE EVAC HIGH FLOW (TUBING) ×1 IMPLANT
SLEEVE Z-THREAD 5X100MM (TROCAR) ×2 IMPLANT
SPIKE FLUID TRANSFER (MISCELLANEOUS) ×1 IMPLANT
STRIP CLOSURE SKIN 1/2X4 (GAUZE/BANDAGES/DRESSINGS) IMPLANT
SUT ETHILON 2 0 PS N (SUTURE) IMPLANT
SUT MNCRL AB 4-0 PS2 18 (SUTURE) ×1 IMPLANT
SUT VICRYL 0 ENDOLOOP (SUTURE) IMPLANT
TOWEL OR 17X26 10 PK STRL BLUE (TOWEL DISPOSABLE) ×1 IMPLANT
TOWEL OR NON WOVEN STRL DISP B (DISPOSABLE) IMPLANT
TRAY LAPAROSCOPIC (CUSTOM PROCEDURE TRAY) ×1 IMPLANT
TROCAR 11X100 Z THREAD (TROCAR) ×1 IMPLANT
TROCAR Z-THREAD OPTICAL 5X100M (TROCAR) ×1 IMPLANT

## 2022-11-03 NOTE — Anesthesia Procedure Notes (Signed)
Procedure Name: Intubation Date/Time: 11/03/2022 2:49 PM  Performed by: Jenne Campus, CRNAPre-anesthesia Checklist: Patient identified, Emergency Drugs available, Suction available and Patient being monitored Patient Re-evaluated:Patient Re-evaluated prior to induction Oxygen Delivery Method: Circle System Utilized Preoxygenation: Pre-oxygenation with 100% oxygen Induction Type: IV induction Ventilation: Mask ventilation without difficulty Laryngoscope Size: Miller and 3 Grade View: Grade I Tube type: Oral Tube size: 7.0 mm Number of attempts: 1 Airway Equipment and Method: Stylet and Oral airway Placement Confirmation: ETT inserted through vocal cords under direct vision, positive ETCO2 and breath sounds checked- equal and bilateral Secured at: 21 cm Tube secured with: Tape Dental Injury: Teeth and Oropharynx as per pre-operative assessment

## 2022-11-03 NOTE — Discharge Instructions (Signed)
[facility] ______[facility] LAPAROSCOPIC SURGERY: POST OP INSTRUCTIONS Always review your discharge instruction sheet given to you by the facility where your surgery was performed. IF YOU HAVE DISABILITY OR FAMILY LEAVE FORMS, YOU MUST BRING THEM TO THE OFFICE FOR PROCESSING.   DO NOT GIVE THEM TO YOUR DOCTOR.  A prescription for pain medication may be given to you upon discharge.  Take your pain medication as prescribed, if needed.  If narcotic pain medicine is not needed, then you may take acetaminophen (Tylenol) or ibuprofen (Advil) as needed. Take your usually prescribed medications unless otherwise directed. If you need a refill on your pain medication, please contact your pharmacy.  They will contact our office to request authorization. Prescriptions will not be filled after 5pm or on week-ends. You should follow a light diet the first few days after arrival home, such as soup and crackers, etc.  Be sure to include lots of fluids daily. Most patients will experience some swelling and bruising in the area of the incisions.  Ice packs will help.  Swelling and bruising can take several days to resolve.  It is common to experience some constipation if taking pain medication after surgery.  Increasing fluid intake and taking a stool softener (such as Colace) will usually help or prevent this problem from occurring.  A mild laxative (Milk of Magnesia or Miralax) should be taken according to package instructions if there are no bowel movements after 48 hours. Unless discharge instructions indicate otherwise, you may remove your bandages 24-48 hours after surgery, and you may shower at that time.  You may have steri-strips (small skin tapes) in place directly over the incision.  These strips should be left on the skin for 7-10 days.  If your surgeon used skin glue on the incision, you may shower in 24 hours.  The glue will flake off over the next 2-3 weeks.  Any sutures or staples will be  removed at the office during your follow-up visit. ACTIVITIES:  You may resume regular (light) daily activities beginning the next day--such as daily self-care, walking, climbing stairs--gradually increasing activities as tolerated.  You may have sexual intercourse when it is comfortable.  Refrain from any heavy lifting or straining until approved by your doctor. You may drive when you are no longer taking prescription pain medication, you can comfortably wear a seatbelt, and you can safely maneuver your car and apply brakes. RETURN TO WORK:  __________________________________________________________ You should see your doctor in the office for a follow-up appointment approximately 2-3 weeks after your surgery.  Make sure that you call for this appointment within a day or two after you arrive home to insure a convenient appointment time. OTHER INSTRUCTIONS: __________________________________________________________________________________________________________________________ __________________________________________________________________________________________________________________________ WHEN TO CALL YOUR DOCTOR: Fever over 101.0 Inability to urinate Continued bleeding from incision. Increased pain, redness, or drainage from the incision. Increasing abdominal pain  The clinic staff is available to answer your questions during regular business hours.  Please don't hesitate to call and ask to speak to one of the nurses for clinical concerns.  If you have a medical emergency, go to the nearest emergency room or call 911.  A surgeon from [facility] is always on call at the hospital. [address], [address], [place], [place]  [address] ? [address] [address], [place], [place]   [address] [phone] ? [phone] ? FAX [phone] Web site: [email or web address]  

## 2022-11-03 NOTE — Anesthesia Preprocedure Evaluation (Addendum)
Anesthesia Evaluation  Patient identified by MRN, date of birth, ID band Patient awake    Reviewed: Allergy & Precautions, NPO status , Patient's Chart, lab work & pertinent test results  History of Anesthesia Complications (+) PONV and history of anesthetic complications  Airway Mallampati: I  TM Distance: >3 FB Neck ROM: Full    Dental no notable dental hx. (+) Teeth Intact, Dental Advisory Given   Pulmonary neg pulmonary ROS   Pulmonary exam normal breath sounds clear to auscultation       Cardiovascular negative cardio ROS Normal cardiovascular exam Rhythm:Regular Rate:Normal  TTE 2023  1. Left ventricular ejection fraction, by estimation, is 60 to 65%. Left  ventricular ejection fraction by PLAX is 65 %. The left ventricle has  normal function. The left ventricle has no regional wall motion  abnormalities. Left ventricular diastolic  parameters are consistent with Grade I diastolic dysfunction (impaired  relaxation). The average left ventricular global longitudinal strain is  -23.9 %. The global longitudinal strain is normal.   2. Right ventricular systolic function is normal. The right ventricular  size is normal. Tricuspid regurgitation signal is inadequate for assessing  PA pressure.   3. The mitral valve is abnormal. Trivial mitral valve regurgitation.   4. The aortic valve is tricuspid. Aortic valve regurgitation is trivial.     Neuro/Psych negative neurological ROS  negative psych ROS   GI/Hepatic negative GI ROS, Neg liver ROS,,,  Endo/Other  negative endocrine ROS    Renal/GU negative Renal ROS  negative genitourinary   Musculoskeletal  (+) Arthritis ,    Abdominal   Peds  Hematology negative hematology ROS (+)   Anesthesia Other Findings   Reproductive/Obstetrics                              Anesthesia Physical Anesthesia Plan  ASA: 2  Anesthesia Plan: General    Post-op Pain Management: Tylenol PO (pre-op)*   Induction: Intravenous  PONV Risk Score and Plan: 4 or greater and Midazolam, Dexamethasone, Ondansetron and Scopolamine patch - Pre-op  Airway Management Planned: Oral ETT  Additional Equipment:   Intra-op Plan:   Post-operative Plan: Extubation in OR  Informed Consent: I have reviewed the patients History and Physical, chart, labs and discussed the procedure including the risks, benefits and alternatives for the proposed anesthesia with the patient or authorized representative who has indicated his/her understanding and acceptance.     Dental advisory given  Plan Discussed with: CRNA  Anesthesia Plan Comments:         Anesthesia Quick Evaluation

## 2022-11-03 NOTE — Op Note (Signed)
PATIENT:  Janice Mercer  62 y.o. female  PRE-OPERATIVE DIAGNOSIS:  CHOLECYSTITIS  POST-OPERATIVE DIAGNOSIS:  CHOLECYSTITIS  PROCEDURE:  Procedure(s): LAPAROSCOPIC CHOLECYSTECTOMY   SURGEON:  Kayin Kettering, Arta Bruce, MD   ASSISTANT: Barkley Boards, [facility]  ANESTHESIA:   local and general  Indications for procedure: Janice Mercer is a 62 y.o. female with symptoms of Abdominal pain and Nausea and vomiting consistent with gallbladder disease, Confirmed by ultrasound.  Description of procedure: The patient was brought into the operative suite, placed supine. Anesthesia was administered with endotracheal tube. Patient was strapped in place and foot board was secured. All pressure points were offloaded by foam padding. The patient was prepped and draped in the usual sterile fashion.  A periumbilical incision was made and optical entry was used to enter the abdomen. 2 5 mm trocars were placed on in the right lateral space on in the right subcostal space. A 46mm trocar was placed in the subxiphoid space. Marcaine was infused to the subxiphoid space and lateral upper right abdomen in the transversus abdominis plane. Next the patient was placed in reverse trendelenberg. The gallbladder appearedfull of stones and acutely inflamed.   The gallbladder was retracted cephalad and lateral. The peritoneum was reflected off the infundibulum working lateral to medial. The cystic duct and cystic artery were identified and further dissection revealed a critical view. The cystic duct and cystic artery were doubly clipped and ligated.   The gallbladder was removed off the liver bed with cautery. The Gallbladder was placed in a specimen bag. The gallbladder fossa was irrigated and hemostasis was applied with cautery. The gallbladder was removed via the 79mm trocar. The fascial defect was closed with interrupted 0 vicryl suture via laparoscopic trans-fascial suture passer. Pneumoperitoneum was removed, all trocar were  removed. All incisions were closed with 4-0 monocryl subcuticular stitch. The patient woke from anesthesia and was brought to PACU in stable condition. All counts were correct  Findings: acute cholecystitis  Specimen: gallbladder  Blood loss: 50 ml  Local anesthesia: 30 ml Marcaine  Complications: none  PLAN OF CARE: Discharge to home after PACU  PATIENT DISPOSITION:  PACU - hemodynamically stable.  [facility], [place]

## 2022-11-03 NOTE — Progress Notes (Signed)
Pre Procedure note for inpatients:   Janice Mercer has been scheduled for Procedure(s): LAPAROSCOPIC CHOLECYSTECTOMY (N/A) today. The various methods of treatment have been discussed with the patient. After consideration of the risks, benefits and treatment options the patient has consented to the planned procedure.   The patient has been seen and labs reviewed. There are no changes in the patient's condition to prevent proceeding with the planned procedure today.  Recent labs:  Lab Results  Component Value Date   WBC 10.0 10/28/2022   HGB 15.2 (H) 10/28/2022   HCT 44.2 10/28/2022   PLT 183 10/28/2022   GLUCOSE 153 (H) 10/28/2022   ALT 19 10/28/2022   AST 28 10/28/2022   NA 138 10/28/2022   K 3.9 10/28/2022   CL 106 10/28/2022   CREATININE 0.83 10/28/2022   BUN 10 10/28/2022   CO2 23 10/28/2022    Mickeal Skinner, MD 11/03/2022 11:55 AM     We discussed the etiology of her pain, we discussed treatment options and recommended surgery. We discussed details of surgery including general anesthesia, laparoscopic approach, identification of cystic duct and common bile duct. Ligation of cystic duct and cystic artery. Possible need for intraoperative cholangiogram or open procedure. Possible risks of common bile duct injury, liver injury, cystic duct leak, bleeding, infection, post-cholecystectomy syndrome. The patient showed good understanding and all questions were answered

## 2022-11-03 NOTE — H&P (Signed)
Admitting Physician: Nickola Major Candiace West  Service: General Surgery  CC: Gallstones  Subjective   HPI: Janice Mercer is an 62 y.o. female who presented to my office originally in 3/23 with one episode of abdominal pain after eating pizza.  She was found to have gallstones and we discussed the option to perform cholecystectomy at that visit.  She elected to observe her symptoms as it only happened once, and attempt to avoid fatty meals and avoid surgery.  She did well till a few weeks ago when she ate pizza and woke up at 2 am with 3 hours of severe abdominal pain, nausea and vomiting.  She had three more episodes over the last two weeks.  She went to [employer] and was told it was likely her gallbladder, but sent home from the ER.  She is afraid of her symtpoms returning at any moment and wants her gallbladder removed ASAP.   Past Medical History:  Diagnosis Date   Arthritis    knee   Complication of anesthesia    slow to wake up , sensitive to anesthesia and zofran  Cries when wakes up   Miscarriage    PONV (postoperative nausea and vomiting)    Restless leg syndrome     Past Surgical History:  Procedure Laterality Date   ADENOIDECTOMY     DILATION AND CURETTAGE OF UTERUS     KNEE SURGERY     5x   OVARIAN CYST SURGERY     TONSILLECTOMY     TOTAL KNEE ARTHROPLASTY Left 08/13/2018   Procedure: LEFT TOTAL KNEE ARTHROPLASTY;  Surgeon: Paralee Cancel, MD;  Location: [facility];  Service: Orthopedics;  Laterality: Left;  70 mins    Family History  Problem Relation Age of Onset   Allergic rhinitis Father    Breast cancer Mother 59    Social:  reports that she has never smoked. She has never used smokeless tobacco. She reports current alcohol use of about 7.0 standard drinks of alcohol per week. She reports that she does not use drugs.  Allergies:  Allergies  Allergen Reactions   Shellfish Allergy Shortness Of Breath, Diarrhea and Nausea And Vomiting   Amoxicillin      Has patient had a PCN reaction causing immediate rash, facial/tongue/throat swelling, SOB or lightheadedness with hypotension: No Has patient had a PCN reaction causing severe rash involving mucus membranes or skin necrosis: No Has patient had a PCN reaction that required hospitalization: No Has patient had a PCN reaction occurring within the last 10 years: Unknown If all of the above answers are "NO", then may proceed with Cephalosporin use.    Lasix [Furosemide] Other (See Comments)    Facial flushing   Sulfa Antibiotics Rash and Other (See Comments)    Depletes potassium    Medications: Current Outpatient Medications  Medication Instructions   BLACK ELDERBERRY PO 5 mLs, Oral, Daily at bedtime   Cholecalciferol (VITAMIN D3 PO) 63 mcg, Oral, Daily   gabapentin (NEURONTIN) 100 mg, Oral, As needed   Magnesium 100 mg, Oral, 3 times weekly, In the evening.   oxyCODONE-acetaminophen (PERCOCET/ROXICET) 5-325 MG tablet 1-2 tablets, Oral, Every 8 hours PRN   Probiotic Product (PROBIOTIC PO) 1 capsule, Oral, Daily    ROS - all of the below systems have been reviewed with the patient and positives are indicated with bold text General: chills, fever or night sweats Eyes: blurry vision or double vision ENT: epistaxis or sore throat Allergy/Immunology: itchy/watery eyes or nasal congestion  Hematologic/Lymphatic: bleeding problems, blood clots or swollen lymph nodes Endocrine: temperature intolerance or unexpected weight changes Breast: new or changing breast lumps or nipple discharge Resp: cough, shortness of breath, or wheezing CV: chest pain or dyspnea on exertion GI: as per HPI GU: dysuria, trouble voiding, or hematuria MSK: joint pain or joint stiffness Neuro: TIA or stroke symptoms Derm: pruritus and skin lesion changes Psych: anxiety and depression  Objective   PE There were no vitals taken for this visit. Constitutional: NAD; conversant; no deformities Eyes: Moist conjunctiva;  no lid lag; anicteric; PERRL Neck: Trachea midline; no thyromegaly Lungs: Normal respiratory effort; no tactile fremitus CV: RRR; no palpable thrills; no pitting edema GI: Abd Soft, nontender; no palpable hepatosplenomegaly MSK: Normal range of motion of extremities; no clubbing/cyanosis Psychiatric: Appropriate affect; alert and oriented x3 Lymphatic: No palpable cervical or axillary lymphadenopathy  Labs and imaging:   Latest Reference Range & Units 10/28/22 05:55  Sodium 135 - 145 mmol/L 138  Potassium 3.5 - 5.1 mmol/L 3.9  Chloride 98 - 111 mmol/L 106  CO2 22 - 32 mmol/L 23  Glucose 70 - 99 mg/dL 153 (H)  BUN 8 - 23 mg/dL 10  Creatinine 0.44 - 1.00 mg/dL 0.83  Calcium 8.9 - 10.3 mg/dL 9.3  Anion gap 5 - 15  9  Alkaline Phosphatase 38 - 126 U/L 82  Albumin 3.5 - 5.0 g/dL 3.9  Lipase 11 - 51 U/L 55 (H)  AST 15 - 41 U/L 28  ALT 0 - 44 U/L 19  Total Protein 6.5 - 8.1 g/dL 7.7  Total Bilirubin 0.3 - 1.2 mg/dL 0.6  GFR, Estimated >60 mL/min >60  WBC 4.0 - 10.5 K/uL 10.0  RBC 3.87 - 5.11 MIL/uL 4.90  Hemoglobin 12.0 - 15.0 g/dL 15.2 (H)  HCT 36.0 - 46.0 % 44.2  MCV 80.0 - 100.0 fL 90.2  MCH 26.0 - 34.0 pg 31.0  MCHC 30.0 - 36.0 g/dL 34.4  RDW 11.5 - 15.5 % 11.4 (L)  Platelets 150 - 400 K/uL 183  nRBC 0.0 - 0.2 % 0.0  (H): Data is abnormally high (L): Data is abnormally low   RUQ [place]  11/17/21 Cholelithiasis without sonographic evidence of acute cholecystitis.   Multiple minimally complex cysts within the liver. Comparison with prior examinations would be helpful in determining chronicity. If none are available, a follow-up sonogram in 6 months would be helpful in confirming stability.    RUQ [place] 10/28/22 1. Cholelithiasis without evidence of acute cholecystitis. 2. Possible trace fluid around the pancreas. Consider CT for better evaluation as indicated. 3. Two benign appearing hepatic cysts are not signigicantly changed, requiring no specific imaging follow up.     Assessment and Plan   Janice Mercer is an 62 y.o. female with abdominal pain found to have cholecystitis.  I recommended laparoscopic cholecystectomy.  Dr. Kieth Brightly is available to do this today and the patient has not eaten today.  We discussed the procedure, its risks, benefits and alternatives and the patient granted consent to proceed.  She will make her way to [facility] for surgery today.   Felicie Morn, MD  [facility] Use [email or web address] to contact on call provider

## 2022-11-03 NOTE — Transfer of Care (Signed)
Immediate Anesthesia Transfer of Care Note  Patient: Janice Mercer  Procedure(s) Performed: LAPAROSCOPIC CHOLECYSTECTOMY  Patient Location: PACU  Anesthesia Type:General  Level of Consciousness: oriented, drowsy, and patient cooperative  Airway & Oxygen Therapy: Patient Spontanous Breathing and Patient connected to nasal cannula oxygen  Post-op Assessment: Report given to RN  Post vital signs: Reviewed  Last Vitals:  Vitals Value Taken Time  BP 157/90 11/03/22 1551  Temp    Pulse 72 11/03/22 1556  Resp 15 11/03/22 1556  SpO2 100 % 11/03/22 1556  Vitals shown include unvalidated device data.  Last Pain:  Vitals:   11/03/22 1142  TempSrc:   PainSc: 0-No pain         Complications: No notable events documented.

## 2022-11-04 ENCOUNTER — Encounter (HOSPITAL_COMMUNITY): Payer: Self-pay | Admitting: General Surgery

## 2022-11-04 NOTE — Anesthesia Postprocedure Evaluation (Signed)
Anesthesia Post Note  Patient: Janice Mercer  Procedure(s) Performed: LAPAROSCOPIC CHOLECYSTECTOMY     Patient location during evaluation: PACU Anesthesia Type: General Level of consciousness: awake and alert Pain management: pain level controlled Vital Signs Assessment: post-procedure vital signs reviewed and stable Respiratory status: spontaneous breathing, nonlabored ventilation, respiratory function stable and patient connected to nasal cannula oxygen Cardiovascular status: blood pressure returned to baseline and stable Postop Assessment: no apparent nausea or vomiting Anesthetic complications: no  No notable events documented.  Last Vitals:  Vitals:   11/03/22 1730 11/03/22 1807  BP: 120/67 135/71  Pulse: 69   Resp:    Temp:    SpO2: 91% 98%    Last Pain:  Vitals:   11/03/22 1730  TempSrc:   PainSc: 2                  Barnet Glasgow

## 2022-11-07 LAB — SURGICAL PATHOLOGY

## 2022-12-06 ENCOUNTER — Encounter: Payer: Self-pay | Admitting: Internal Medicine

## 2023-03-03 IMAGING — MG MM DIGITAL SCREENING BILAT W/ TOMO AND CAD
8 series · 8 of 24 positions shown · non-contrast
Comparison: Previous exam(s).

CLINICAL DATA: Screening.

EXAM:
DIGITAL SCREENING BILATERAL MAMMOGRAM WITH TOMOSYNTHESIS AND CAD
TECHNIQUE: Bilateral screening digital craniocaudal and mediolateral oblique
mammograms were obtained. Bilateral screening digital breast
tomosynthesis was performed. The images were evaluated with
computer-aided detection.

[R CC synth-2D]
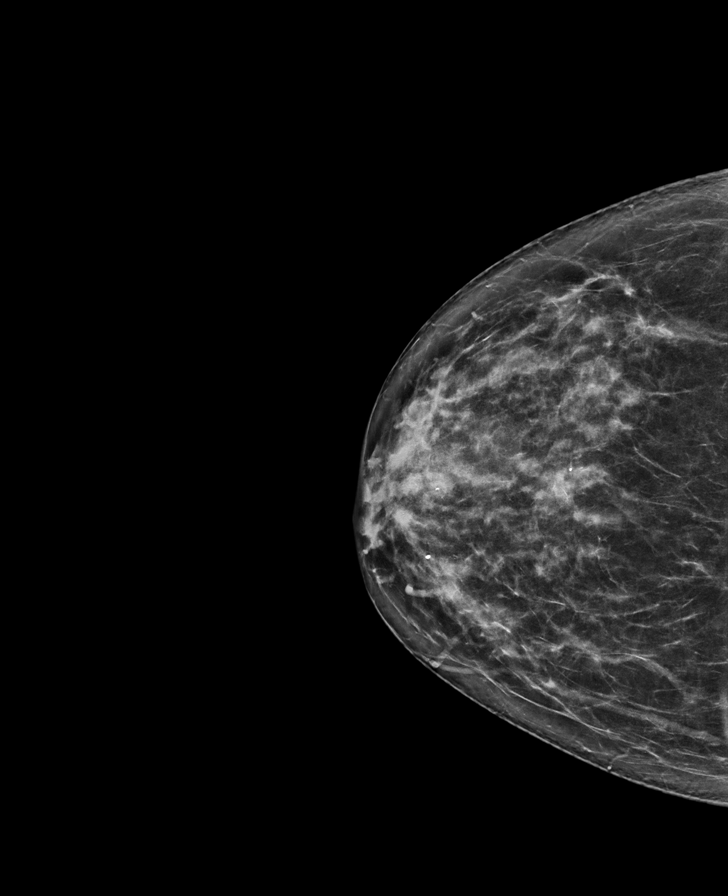

[R MLO synth-2D]
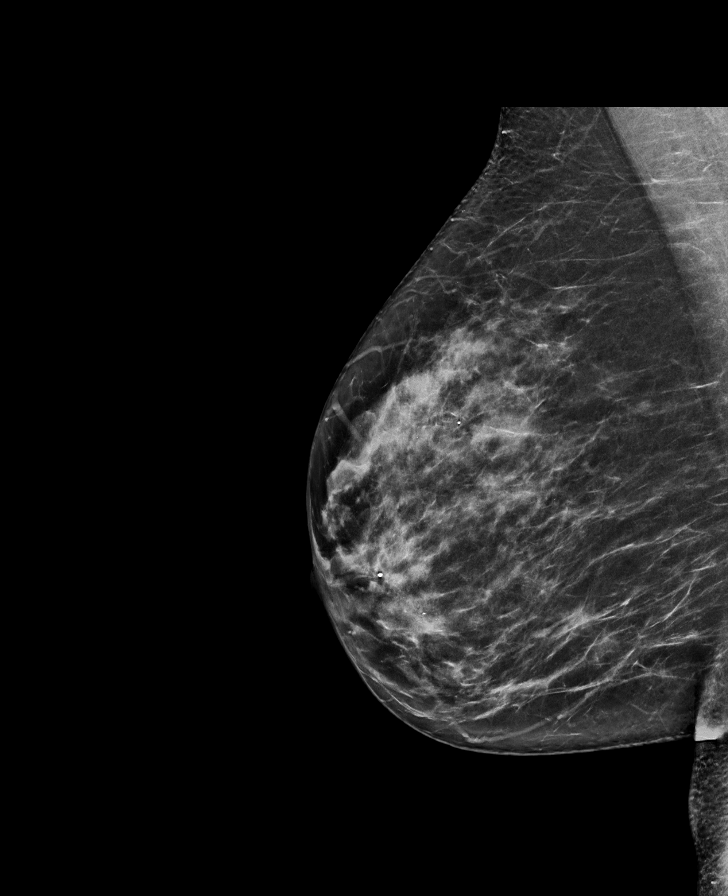

[L MLO synth-2D]
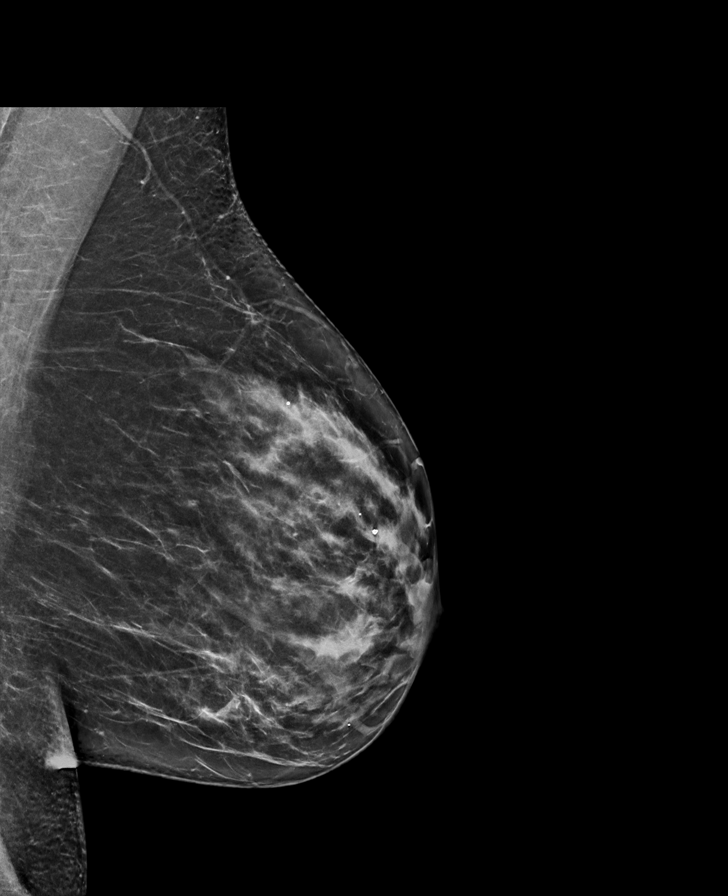

[L CC synth-2D]
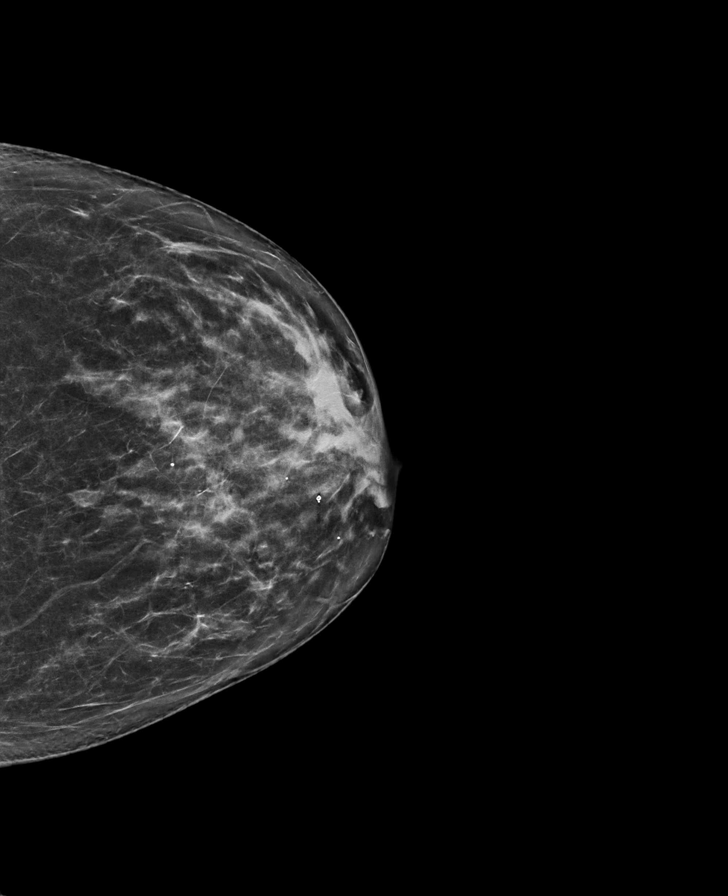

[R CC tomo · tomo slice 38/75.0]
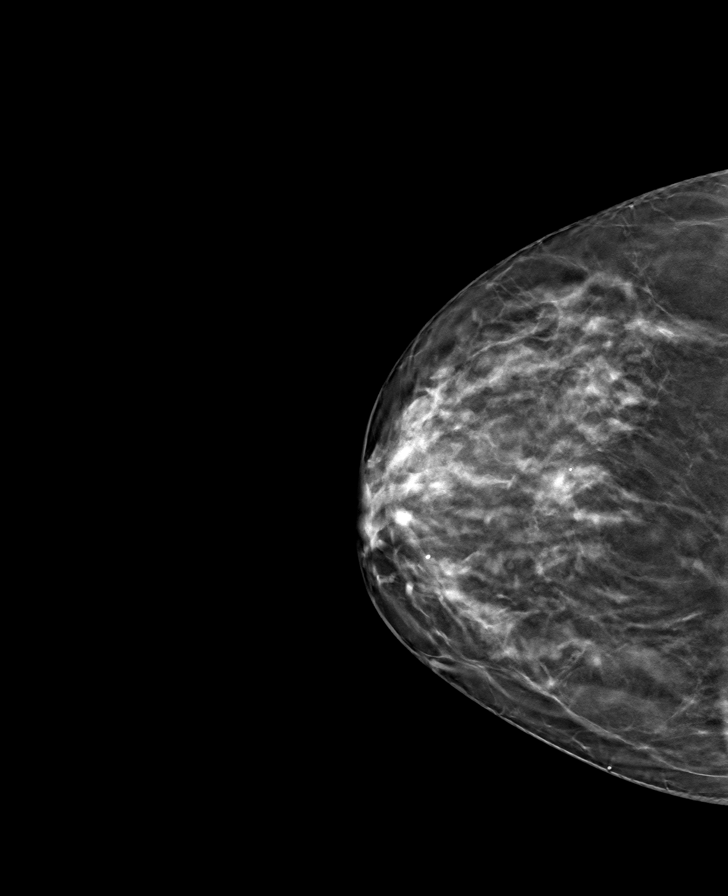

[L MLO tomo · tomo slice 38/75.0]
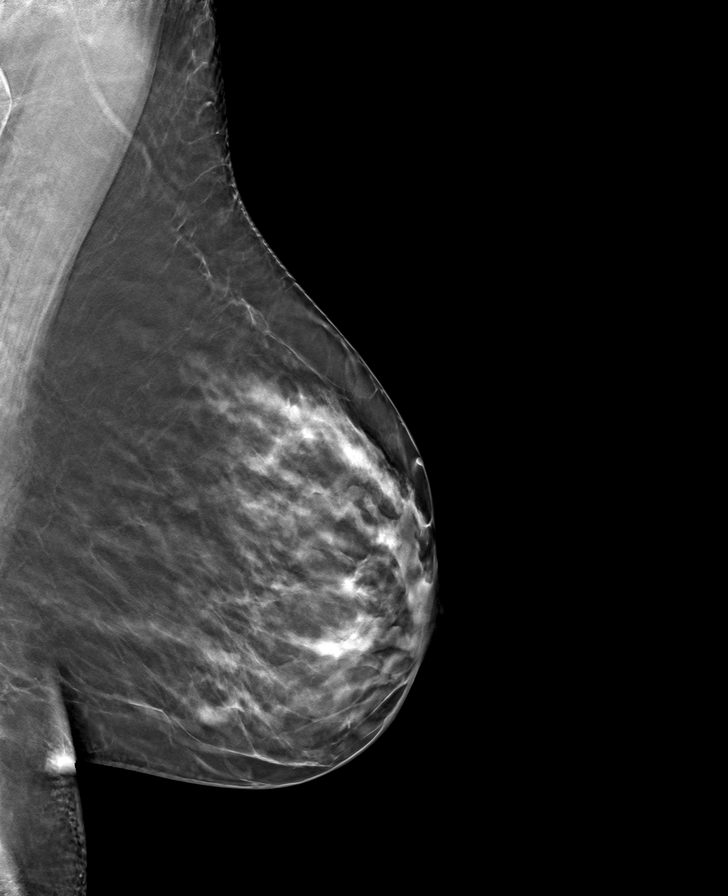

[L CC tomo · tomo slice 35/70.0]
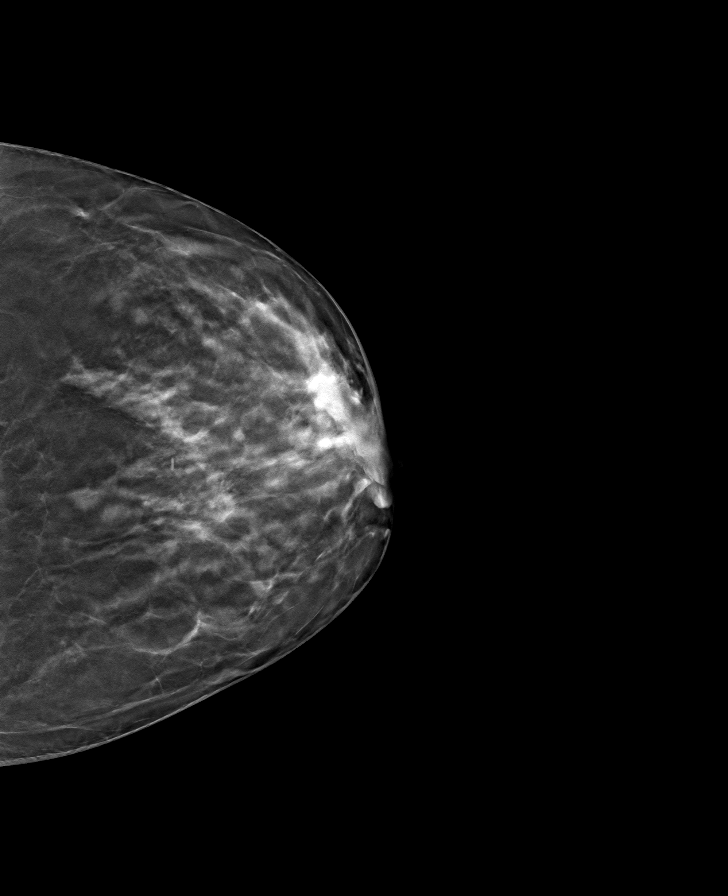

[R MLO tomo · tomo slice 39/76.0]
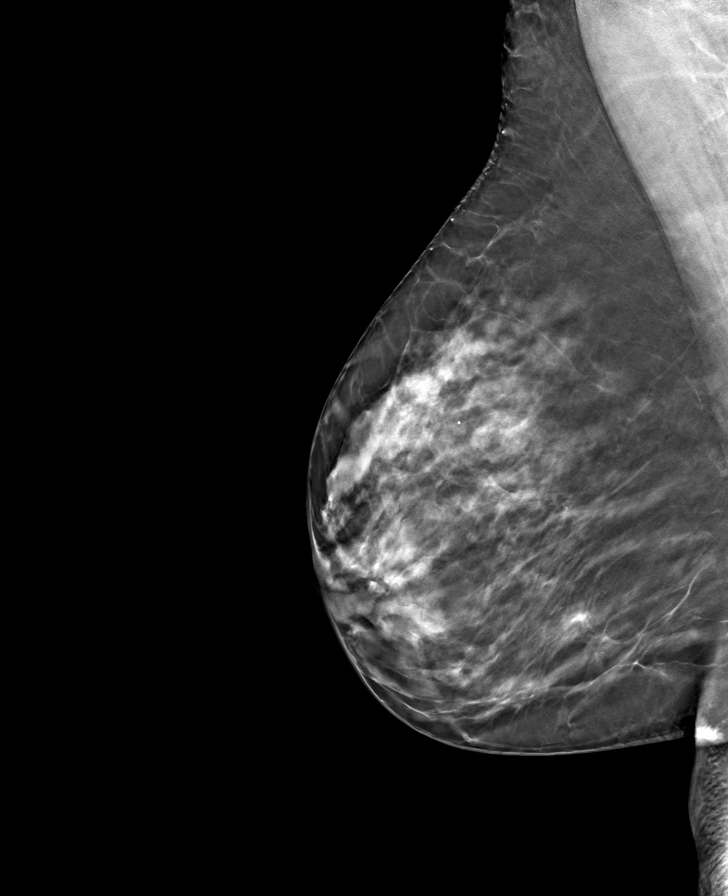

[8 of 24 positions shown; findings below may reference images not displayed]

ACR Breast Density Category c: The breast tissue is heterogeneously
dense, which may obscure small masses.
FINDINGS: There are no findings suspicious for malignancy. The images were
evaluated with computer-aided detection.
IMPRESSION: No mammographic evidence of malignancy. A result letter of this
screening mammogram will be mailed directly to the patient.

RECOMMENDATION:
Screening mammogram in one year. (Code:T4-5-GWO)

BI-RADS CATEGORY  1: Negative.

## 2023-06-01 ENCOUNTER — Other Ambulatory Visit: Payer: Self-pay | Admitting: Family Medicine

## 2023-06-01 DIAGNOSIS — M8580 Other specified disorders of bone density and structure, unspecified site: Secondary | ICD-10-CM

## 2023-06-13 ENCOUNTER — Other Ambulatory Visit: Payer: Self-pay | Admitting: Family Medicine

## 2023-06-13 DIAGNOSIS — Z1231 Encounter for screening mammogram for malignant neoplasm of breast: Secondary | ICD-10-CM

## 2023-06-27 ENCOUNTER — Ambulatory Visit
Admission: RE | Admit: 2023-06-27 | Discharge: 2023-06-27 | Disposition: A | Discharge status: Home / Self Care | Payer: Managed Care, Other (non HMO) | Source: Ambulatory Visit

## 2023-06-27 DIAGNOSIS — Z1231 Encounter for screening mammogram for malignant neoplasm of breast: Secondary | ICD-10-CM

## 2023-08-16 ENCOUNTER — Other Ambulatory Visit (HOSPITAL_COMMUNITY): Payer: Self-pay | Admitting: Family Medicine

## 2023-08-16 DIAGNOSIS — E785 Hyperlipidemia, unspecified: Secondary | ICD-10-CM

## 2023-08-21 ENCOUNTER — Ambulatory Visit (HOSPITAL_COMMUNITY)
Admission: RE | Admit: 2023-08-21 | Discharge: 2023-08-21 | Disposition: A | Discharge status: Home / Self Care | Payer: Managed Care, Other (non HMO) | Source: Ambulatory Visit | Attending: Family Medicine | Admitting: Family Medicine

## 2023-08-21 DIAGNOSIS — E785 Hyperlipidemia, unspecified: Secondary | ICD-10-CM | POA: Insufficient documentation

## 2024-01-06 IMAGING — US US ABDOMEN LIMITED
1 series · 13 of 25 positions shown · non-contrast
Comparison: None.

CLINICAL DATA: Epigastric abdominal pain

EXAM:
ULTRASOUND ABDOMEN LIMITED RIGHT UPPER QUADRANT

[Series 1: us abdomen limited · 0.17mm/px · 13 of 68 slices shown]
[im 1/68]
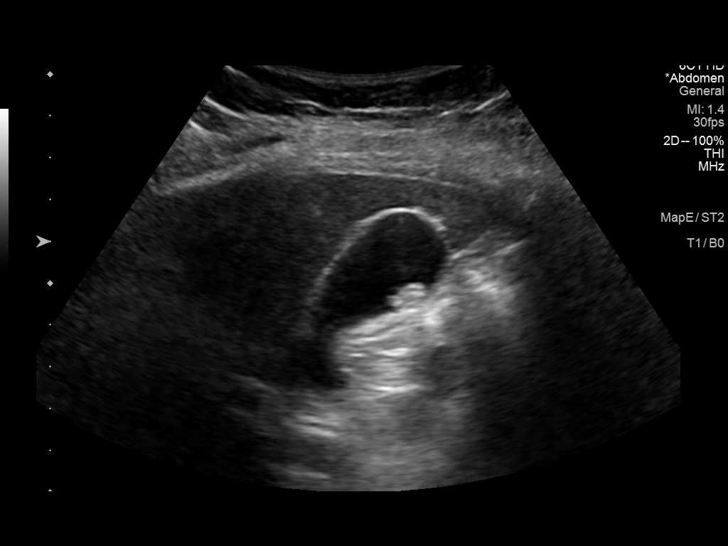
[im 6/68]
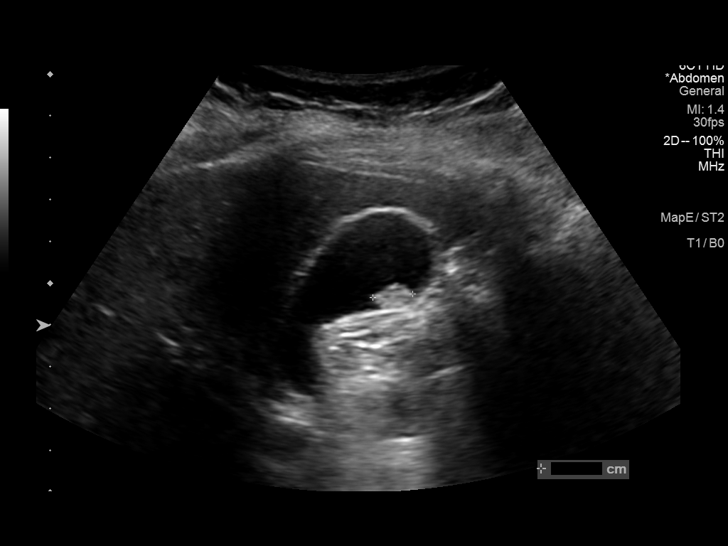
[im 12/68]
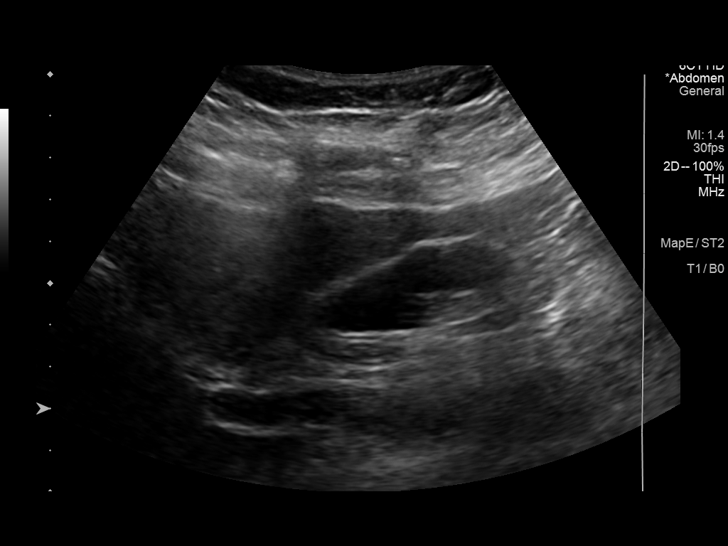
[im 17/68]
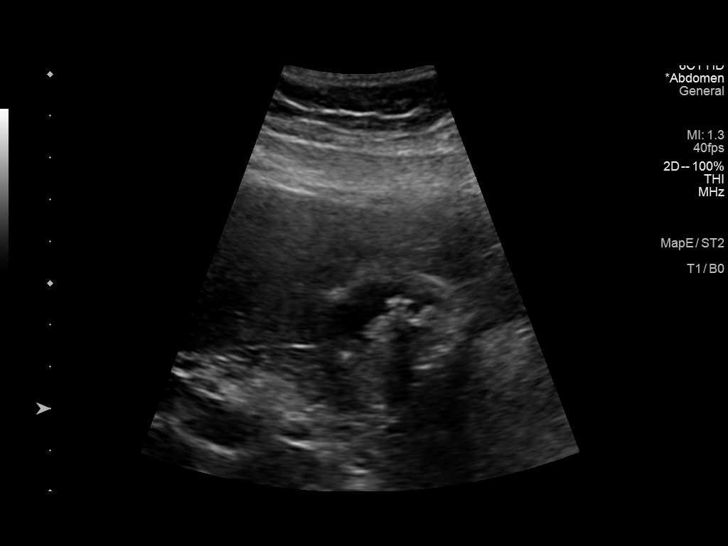
[im 23/68]
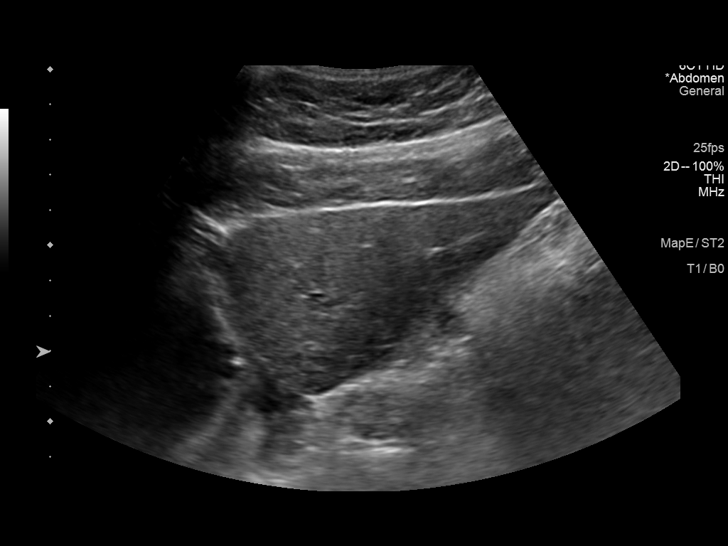
[im 28/68]
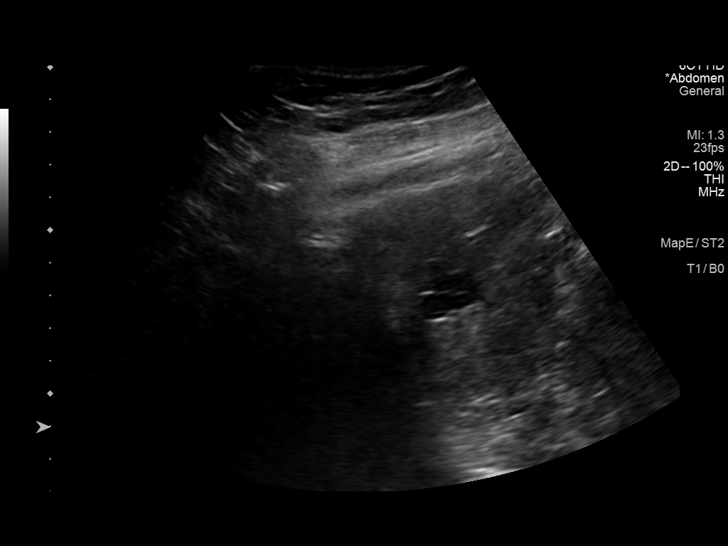
[im 34/68]
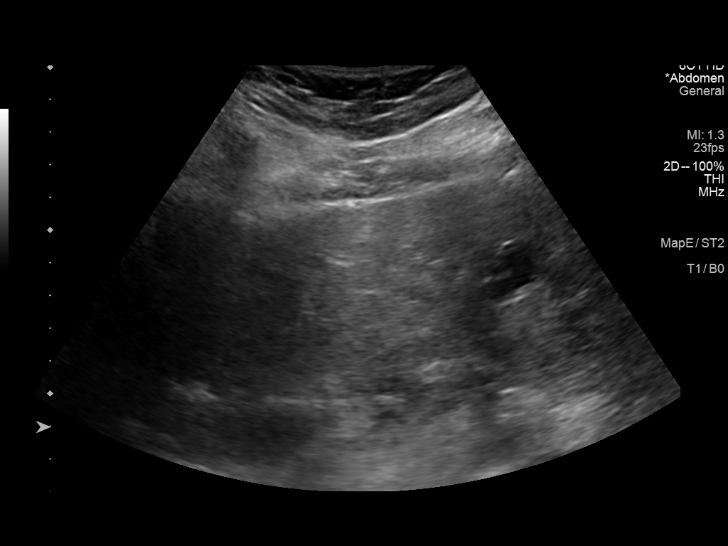
[im 40/68]
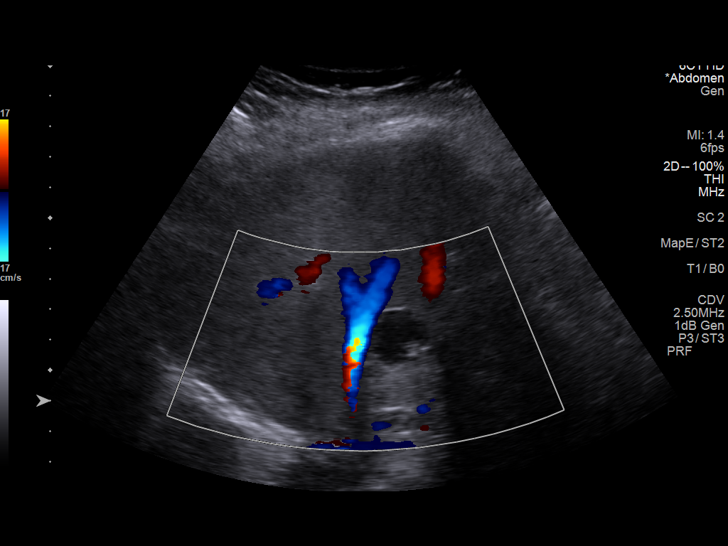
[im 45/68]
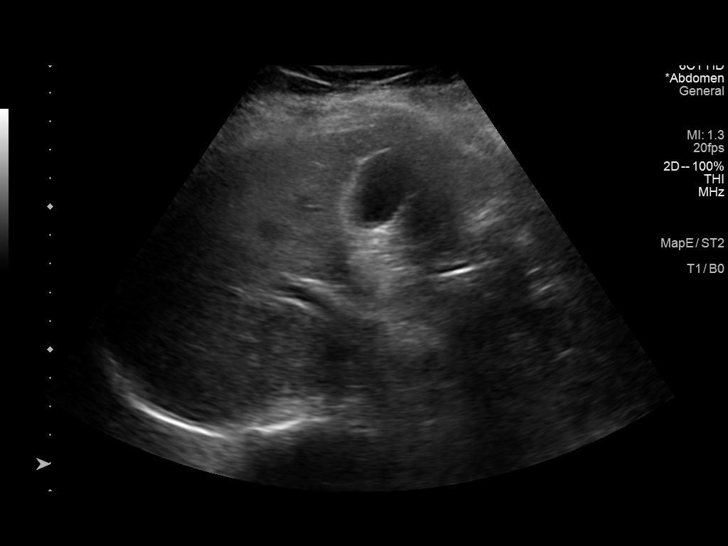
[im 51/68]
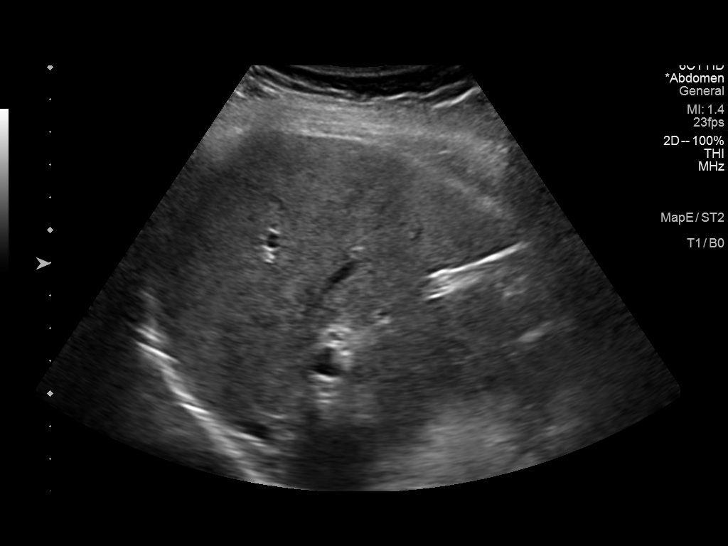
[im 56/68]
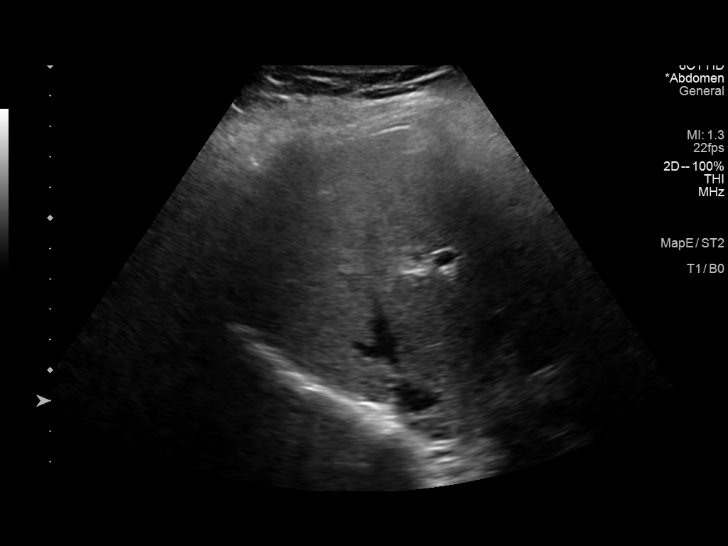
[im 62/68]
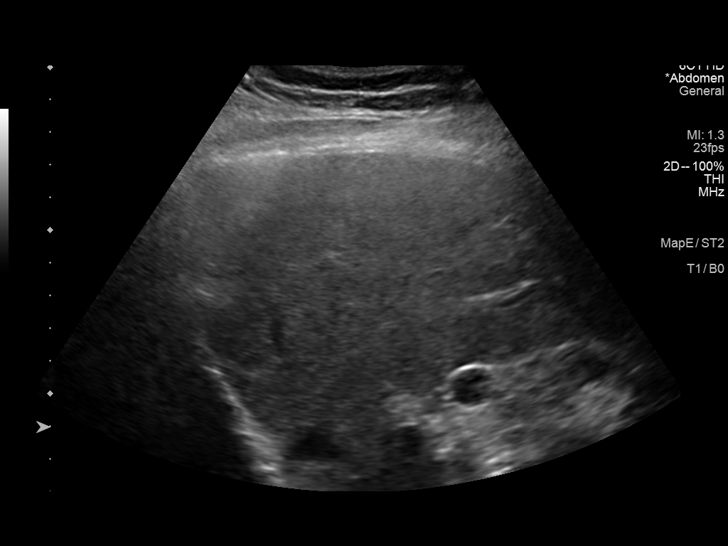
[im 68/68]
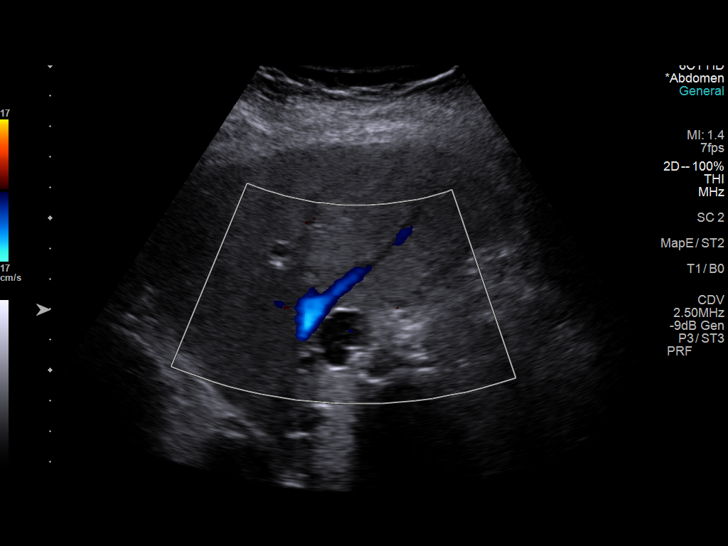

[13 of 25 positions shown; findings below may reference images not displayed]

FINDINGS: Gallbladder:

Multiple layering gallstones are seen within the gallbladder. The
gallbladder, however, is not distended, there is no gallbladder wall
thickening, and no pericholecystic fluid is identified. The
sonographic Murphy sign is reportedly negative.

Common bile duct:

Diameter: 4 mm in proximal diameter

Liver:

Hepatic parenchymal echogenicity and echotexture is normal. There is
no intrahepatic biliary ductal dilation. No solid intrahepatic
masses are identified. There are 2 minimally complex cysts
identified within the liver, 1 within the left hepatic lobe
measuring 2.1 x 1.7 x 2.1 cm demonstrating a single thin
noncalcified internal septation and 1 within the medial right
hepatic lobe measuring 1.9 x 1.8 x 2.2 cm demonstrating a 5 mm mural
nodule. Portal vein is patent on color Doppler imaging with normal
direction of blood flow towards the liver.

Other: None.
IMPRESSION: Cholelithiasis without sonographic evidence of acute cholecystitis.

Multiple minimally complex cysts within the liver. Comparison with
prior examinations would be helpful in determining chronicity. If
none are available, a follow-up sonogram in 6 months would be
helpful in confirming stability.

## 2024-02-12 ENCOUNTER — Ambulatory Visit
Admission: RE | Admit: 2024-02-12 | Discharge: 2024-02-12 | Disposition: A | Discharge status: Home / Self Care | Payer: Managed Care, Other (non HMO) | Source: Ambulatory Visit | Attending: Family Medicine | Admitting: Family Medicine

## 2024-02-12 DIAGNOSIS — M8580 Other specified disorders of bone density and structure, unspecified site: Secondary | ICD-10-CM

## 2024-06-19 ENCOUNTER — Other Ambulatory Visit: Payer: Self-pay | Admitting: Family Medicine

## 2024-06-19 DIAGNOSIS — Z1231 Encounter for screening mammogram for malignant neoplasm of breast: Secondary | ICD-10-CM

## 2024-07-17 ENCOUNTER — Ambulatory Visit
Admission: RE | Admit: 2024-07-17 | Discharge: 2024-07-17 | Disposition: A | Discharge status: Home / Self Care | Source: Ambulatory Visit

## 2024-07-17 DIAGNOSIS — Z1231 Encounter for screening mammogram for malignant neoplasm of breast: Secondary | ICD-10-CM
